# Patient Record
Sex: Female | Born: 1935 | Race: White | Hispanic: No | Smoking: Current some day smoker
Health system: Southern US, Community
[De-identification: ages and names within clinical notes are randomized; demographics above are authoritative.]

## PROBLEM LIST (undated history)

## (undated) DIAGNOSIS — I10 Essential (primary) hypertension: Secondary | ICD-10-CM

## (undated) DIAGNOSIS — I739 Peripheral vascular disease, unspecified: Secondary | ICD-10-CM

## (undated) DIAGNOSIS — M25562 Pain in left knee: Secondary | ICD-10-CM

## (undated) DIAGNOSIS — K219 Gastro-esophageal reflux disease without esophagitis: Secondary | ICD-10-CM

## (undated) DIAGNOSIS — I499 Cardiac arrhythmia, unspecified: Secondary | ICD-10-CM

## (undated) DIAGNOSIS — R0602 Shortness of breath: Secondary | ICD-10-CM

## (undated) DIAGNOSIS — J449 Chronic obstructive pulmonary disease, unspecified: Secondary | ICD-10-CM

## (undated) DIAGNOSIS — I509 Heart failure, unspecified: Secondary | ICD-10-CM

## (undated) DIAGNOSIS — E7800 Pure hypercholesterolemia, unspecified: Secondary | ICD-10-CM

## (undated) DIAGNOSIS — C44601 Unspecified malignant neoplasm of skin of unspecified upper limb, including shoulder: Secondary | ICD-10-CM

## (undated) HISTORY — PX: OTHER SURGICAL HISTORY: SHX169

## (undated) HISTORY — PX: TOTAL ABDOMINAL HYSTERECTOMY W/ BILATERAL SALPINGOOPHORECTOMY: SHX83

---

## 1998-08-16 ENCOUNTER — Ambulatory Visit (HOSPITAL_COMMUNITY): Admission: RE | Admit: 1998-08-16 | Discharge: 1998-08-16 | Payer: Self-pay | Admitting: Gynecology

## 1998-08-16 ENCOUNTER — Other Ambulatory Visit: Admission: RE | Admit: 1998-08-16 | Discharge: 1998-08-16 | Payer: Self-pay | Admitting: Gynecology

## 1998-10-11 ENCOUNTER — Ambulatory Visit (HOSPITAL_COMMUNITY): Admission: RE | Admit: 1998-10-11 | Discharge: 1998-10-11 | Payer: Self-pay | Admitting: Gastroenterology

## 1999-09-26 ENCOUNTER — Other Ambulatory Visit: Admission: RE | Admit: 1999-09-26 | Discharge: 1999-09-26 | Payer: Self-pay | Admitting: Gynecology

## 2000-10-02 ENCOUNTER — Other Ambulatory Visit: Admission: RE | Admit: 2000-10-02 | Discharge: 2000-10-02 | Payer: Self-pay | Admitting: Gynecology

## 2001-09-30 ENCOUNTER — Encounter: Payer: Self-pay | Admitting: Internal Medicine

## 2001-09-30 ENCOUNTER — Inpatient Hospital Stay (HOSPITAL_COMMUNITY): Admission: AD | Admit: 2001-09-30 | Discharge: 2001-10-02 | Payer: Self-pay | Admitting: Internal Medicine

## 2001-10-14 ENCOUNTER — Other Ambulatory Visit: Admission: RE | Admit: 2001-10-14 | Discharge: 2001-10-14 | Payer: Self-pay | Admitting: Gynecology

## 2001-10-14 ENCOUNTER — Emergency Department (HOSPITAL_COMMUNITY): Admission: EM | Admit: 2001-10-14 | Discharge: 2001-10-14 | Payer: Self-pay | Admitting: Emergency Medicine

## 2002-05-13 ENCOUNTER — Encounter: Payer: Self-pay | Admitting: Cardiovascular Disease

## 2002-05-13 ENCOUNTER — Ambulatory Visit (HOSPITAL_COMMUNITY): Admission: RE | Admit: 2002-05-13 | Discharge: 2002-05-13 | Payer: Self-pay | Admitting: Cardiovascular Disease

## 2002-07-21 ENCOUNTER — Ambulatory Visit (HOSPITAL_COMMUNITY): Admission: RE | Admit: 2002-07-21 | Discharge: 2002-07-21 | Payer: Self-pay | Admitting: Internal Medicine

## 2002-07-21 ENCOUNTER — Encounter: Payer: Self-pay | Admitting: Internal Medicine

## 2002-07-25 ENCOUNTER — Emergency Department (HOSPITAL_COMMUNITY): Admission: EM | Admit: 2002-07-25 | Discharge: 2002-07-26 | Payer: Self-pay | Admitting: *Deleted

## 2003-06-20 ENCOUNTER — Ambulatory Visit (HOSPITAL_COMMUNITY): Admission: RE | Admit: 2003-06-20 | Discharge: 2003-06-20 | Payer: Self-pay | Admitting: Gastroenterology

## 2003-06-20 ENCOUNTER — Encounter (INDEPENDENT_AMBULATORY_CARE_PROVIDER_SITE_OTHER): Payer: Self-pay | Admitting: Specialist

## 2004-03-30 ENCOUNTER — Other Ambulatory Visit: Admission: RE | Admit: 2004-03-30 | Discharge: 2004-03-30 | Payer: Self-pay | Admitting: Gynecology

## 2004-05-10 ENCOUNTER — Ambulatory Visit (HOSPITAL_COMMUNITY): Admission: RE | Admit: 2004-05-10 | Discharge: 2004-05-10 | Payer: Self-pay | Admitting: Gynecology

## 2004-11-06 ENCOUNTER — Emergency Department (HOSPITAL_COMMUNITY): Admission: EM | Admit: 2004-11-06 | Discharge: 2004-11-06 | Payer: Self-pay | Admitting: Emergency Medicine

## 2004-11-07 ENCOUNTER — Emergency Department (HOSPITAL_COMMUNITY): Admission: EM | Admit: 2004-11-07 | Discharge: 2004-11-08 | Payer: Self-pay | Admitting: Emergency Medicine

## 2005-03-24 ENCOUNTER — Emergency Department (HOSPITAL_COMMUNITY): Admission: EM | Admit: 2005-03-24 | Discharge: 2005-03-24 | Payer: Self-pay | Admitting: Emergency Medicine

## 2005-06-11 ENCOUNTER — Ambulatory Visit (HOSPITAL_COMMUNITY): Admission: RE | Admit: 2005-06-11 | Discharge: 2005-06-11 | Payer: Self-pay | Admitting: Cardiovascular Disease

## 2005-06-14 ENCOUNTER — Ambulatory Visit (HOSPITAL_COMMUNITY): Admission: RE | Admit: 2005-06-14 | Discharge: 2005-06-14 | Payer: Self-pay | Admitting: Cardiovascular Disease

## 2005-10-25 ENCOUNTER — Emergency Department (HOSPITAL_COMMUNITY): Admission: EM | Admit: 2005-10-25 | Discharge: 2005-10-25 | Payer: Self-pay | Admitting: Emergency Medicine

## 2006-01-08 ENCOUNTER — Emergency Department (HOSPITAL_COMMUNITY): Admission: EM | Admit: 2006-01-08 | Discharge: 2006-01-08 | Payer: Self-pay | Admitting: Emergency Medicine

## 2006-03-31 ENCOUNTER — Other Ambulatory Visit: Admission: RE | Admit: 2006-03-31 | Discharge: 2006-03-31 | Payer: Self-pay | Admitting: Gynecology

## 2006-08-05 HISTORY — PX: INSERT / REPLACE / REMOVE PACEMAKER: SUR710

## 2006-08-27 ENCOUNTER — Encounter (INDEPENDENT_AMBULATORY_CARE_PROVIDER_SITE_OTHER): Payer: Self-pay | Admitting: Specialist

## 2006-08-27 ENCOUNTER — Ambulatory Visit (HOSPITAL_COMMUNITY): Admission: RE | Admit: 2006-08-27 | Discharge: 2006-08-27 | Payer: Self-pay | Admitting: Gastroenterology

## 2007-03-29 ENCOUNTER — Observation Stay (HOSPITAL_COMMUNITY): Admission: EM | Admit: 2007-03-29 | Discharge: 2007-04-02 | Payer: Self-pay | Admitting: Emergency Medicine

## 2007-04-13 ENCOUNTER — Inpatient Hospital Stay (HOSPITAL_COMMUNITY): Admission: RE | Admit: 2007-04-13 | Discharge: 2007-04-14 | Payer: Self-pay | Admitting: *Deleted

## 2008-05-12 ENCOUNTER — Ambulatory Visit (HOSPITAL_COMMUNITY): Admission: RE | Admit: 2008-05-12 | Discharge: 2008-05-12 | Payer: Self-pay | Admitting: Family Medicine

## 2008-05-13 ENCOUNTER — Ambulatory Visit (HOSPITAL_COMMUNITY): Admission: RE | Admit: 2008-05-13 | Discharge: 2008-05-13 | Payer: Self-pay | Admitting: Family Medicine

## 2008-05-26 ENCOUNTER — Ambulatory Visit (HOSPITAL_COMMUNITY): Admission: RE | Admit: 2008-05-26 | Discharge: 2008-05-26 | Payer: Self-pay | Admitting: Family Medicine

## 2009-05-11 ENCOUNTER — Encounter: Payer: Self-pay | Admitting: Orthopedic Surgery

## 2009-05-11 ENCOUNTER — Ambulatory Visit (HOSPITAL_COMMUNITY): Admission: RE | Admit: 2009-05-11 | Discharge: 2009-05-11 | Payer: Self-pay | Admitting: Family Medicine

## 2009-06-20 ENCOUNTER — Ambulatory Visit (HOSPITAL_COMMUNITY): Admission: RE | Admit: 2009-06-20 | Discharge: 2009-06-20 | Payer: Self-pay | Admitting: Family Medicine

## 2010-06-04 ENCOUNTER — Encounter: Payer: Self-pay | Admitting: Orthopedic Surgery

## 2010-06-06 ENCOUNTER — Ambulatory Visit: Payer: Self-pay | Admitting: Orthopedic Surgery

## 2010-06-06 DIAGNOSIS — M1710 Unilateral primary osteoarthritis, unspecified knee: Secondary | ICD-10-CM

## 2010-06-07 ENCOUNTER — Encounter: Payer: Self-pay | Admitting: Orthopedic Surgery

## 2010-06-15 ENCOUNTER — Encounter: Payer: Self-pay | Admitting: Orthopedic Surgery

## 2010-07-12 ENCOUNTER — Encounter: Payer: Self-pay | Admitting: Orthopedic Surgery

## 2010-07-18 ENCOUNTER — Ambulatory Visit: Payer: Self-pay | Admitting: Orthopedic Surgery

## 2010-07-18 DIAGNOSIS — IMO0002 Reserved for concepts with insufficient information to code with codable children: Secondary | ICD-10-CM | POA: Insufficient documentation

## 2010-08-02 ENCOUNTER — Telehealth: Payer: Self-pay | Admitting: Orthopedic Surgery

## 2010-08-03 ENCOUNTER — Encounter: Payer: Self-pay | Admitting: Orthopedic Surgery

## 2010-08-05 HISTORY — PX: CATARACT EXTRACTION, BILATERAL: SHX1313

## 2010-08-21 ENCOUNTER — Ambulatory Visit
Admission: RE | Admit: 2010-08-21 | Discharge: 2010-08-21 | Payer: Self-pay | Source: Home / Self Care | Attending: Orthopedic Surgery | Admitting: Orthopedic Surgery

## 2010-08-23 ENCOUNTER — Encounter: Payer: Self-pay | Admitting: Orthopedic Surgery

## 2010-08-24 ENCOUNTER — Encounter (HOSPITAL_COMMUNITY)
Admission: RE | Admit: 2010-08-24 | Discharge: 2010-09-04 | Payer: Self-pay | Source: Home / Self Care | Attending: Orthopedic Surgery | Admitting: Orthopedic Surgery

## 2010-09-04 NOTE — Letter (Signed)
Summary: Medication List  Medication List   Imported By: Jacklynn Ganong 06/11/2010 11:31:08  _____________________________________________________________________  External Attachment:    Type:   Image     Comment:   External Document

## 2010-09-04 NOTE — Letter (Signed)
Summary: Previous notes from Dr. Alanda Amass  Previous notes from Dr. Alanda Amass   Imported By: Jacklynn Ganong 06/07/2010 08:10:41  _____________________________________________________________________  External Attachment:    Type:   Image     Comment:   External Document

## 2010-09-04 NOTE — Letter (Signed)
Summary: Out of Work  [facility]  [address]  [place], [place] [address]   Phone: [phone]  Fax: [phone]    June 06, 2010   Employee:  [name]    To Whom It May Concern:   For Medical reasons, please excuse the above named employee from work for the next 6 weeks   If you need additional information, please feel free to contact our office.         Sincerely,    Fuller Canada MD

## 2010-09-04 NOTE — Letter (Signed)
Summary: History form  History form   Imported By: Jacklynn Ganong 06/07/2010 08:09:48  _____________________________________________________________________  External Attachment:    Type:   Image     Comment:   External Document

## 2010-09-04 NOTE — Letter (Signed)
Summary: FMLA form  FMLA form   Imported By: Cammie Sickle 06/25/2010 12:56:45  _____________________________________________________________________  External Attachment:    Type:   Image     Comment:   External Document

## 2010-09-04 NOTE — Assessment & Plan Note (Signed)
Summary: left knee pain needs xr/weintraub/bsf   Visit Type:  Initial Consult Referring Provider:  Dr. Alanda Amass  CC:  left knee pain.  History of Present Illness: I saw [name] in the office today for an initial visit.  She is a 77 years old woman with the complaint of:  left knee.  Xrays today.  This is a 77 year old female, who was referred to [place] by the cardiologist for LEFT knee pain.  The patient has pain in her knee with cold weather, which she describes as throbbing intermittent and worse with standing. She reports some bruising, as well as some swelling, although there's been no trauma. Her pain is reported to be 6/10. The pain occurs morning and night as well. There is an indication from her cardiologist that she has responded to injections in the past. She has an x-ray from 2010, which shows mild to moderate joint space narrowing, equal on both sides, consistent with mild to moderate osteoarthritis.  She is currently on Mobic since mid-October and he also took her out of work on October 14.  Allergies (verified): 1)  ! Penicillin  Past History:  Past Surgical History: T A H and B S O Pacemaker:  Polyps in throat  Review of Systems Constitutional:  Denies weight loss, weight gain, fever, chills, and fatigue. Cardiovascular:  Denies chest pain, palpitations, fainting, and murmurs. Respiratory:  Complains of couch; denies short of breath, wheezing, tightness, pain on inspiration, and snoring . Gastrointestinal:  Denies heartburn, nausea, vomiting, diarrhea, constipation, and blood in your stools. Genitourinary:  Denies frequency, urgency, difficulty urinating, painful urination, flank pain, and bleeding in urine. Neurologic:  Denies numbness, tingling, unsteady gait, dizziness, tremors, and seizure. Musculoskeletal:  Complains of joint pain and swelling; denies instability, stiffness, redness, heat, and muscle pain. Endocrine:  Complains of heat or cold intolerance;  denies excessive thirst and exessive urination. Psychiatric:  Denies nervousness, depression, anxiety, and hallucinations. Skin:  Denies changes in the skin, poor healing, rash, itching, and redness. HEENT:  Complains of blurred or double vision; denies eye pain, redness, and watering. Immunology:  Denies seasonal allergies, sinus problems, and allergic to bee stings. Hemoatologic:  Complains of easy bleeding and brusing.  Physical Exam  Skin:  intact without lesions or rashes Inguinal Nodes:  no significant adenopathy Psych:  alert and cooperative; normal mood and affect; normal attention span and concentration   Knee Exam  General:    Well-developed, well-nourished, normal body habitus; no deformities, normal grooming.  Gait:    Normal heel-toe gait pattern bilaterally.    Skin:    Intact, no scars, lesions, rashes, cafe au lait spots, or bruising.    Inspection:     No deformity, ecchymosis or swelling.   Palpation:    tenderness L-medial joint line and tenderness L-lateral joint line.    Vascular:    There was no swelling or varicose veins. The pulses and temperature are normal. There was no edema or tenderness.  Sensory:    Gross coordination and sensation were normal.    Motor:    Motor strength 5/5 bilaterally for quadriceps, hamstrings, ankle dorsiflexion, and ankle plantar flexion.    Reflexes:    Normal and symmetric patellar and Achilles reflexes bilaterally.    Knee Exam:    Right:    Inspection:  Normal    Palpation:  Normal    Range of Motion:       Flexion-Active: full    Left:    Inspection:  Abnormal  Palpation:  Abnormal    Stability:  stable    Swelling:  no    Range of Motion:       Flexion-Active: full   Impression & Recommendations:  Problem # 1:  KNEE, ARTHRITIS, DEGEN./OSTEO (ICD-715.96)  x-rays of the LEFT knee, show that she has arthritis, mild to moderate minimal deformity.  Impression mild-to-moderate arthritis, and minimal  deformity.  The LEFT knee was injected Verbal consent was obtained. The knee was prepped with alcohol and ethyl chloride. 1 cc of depomedrol 40mg /cc and 4 cc of lidocaine 1% was injected. there were no complications.  Orders: New Patient Level III (95284) Knee x-ray,  3 views (13244) Joint Aspirate / Injection, Large (20610) Depo- Medrol 40mg  (J1030)  Patient Instructions: 1)  You have received an injection of cortisone today. You may experience increased pain at the injection site. Apply ice pack to the area for 20 minutes every 2 hours and take 2 xtra strength tylenol every 8 hours. This increased pain will usually resolve in 24 hours. The injection will take effect in 3-10 days.  2)  OOW 6 weeks  3)  Recommend knee sleeve (pick up from [facility])   Orders Added: 1)  New Patient Level III [01027] 2)  Knee x-ray,  3 views [73562] 3)  Joint Aspirate / Injection, Large [20610] 4)  Depo- Medrol 40mg  [J1030]

## 2010-09-04 NOTE — Letter (Signed)
Summary: *Consult Note  [facility]  [address]  [place], [place] [address]   Phone: [phone]  Fax: [phone]    Re:    [name] Jose DOB:    03-31-1930   Dear: Dr Alanda Amass    Thank you for requesting that we see the above patient for consultation.  A copy of the detailed office note will be sent under separate cover, for your review.  Evaluation today is consistent with: arthritis    Our recommendation is for: injection, brace and continue Mobic. She is OOW x 6 weeks.   Thank you for this opportunity to look after your patient.  Sincerely,   Fuller Canada MD

## 2010-09-05 ENCOUNTER — Encounter: Payer: Self-pay | Admitting: Orthopedic Surgery

## 2010-09-05 ENCOUNTER — Ambulatory Visit (HOSPITAL_COMMUNITY): Payer: Self-pay

## 2010-09-06 ENCOUNTER — Encounter: Payer: Self-pay | Admitting: Orthopedic Surgery

## 2010-09-06 NOTE — Letter (Signed)
Summary: [facility] form   [facility] form   Imported By: Cammie Sickle 08/28/2010 17:39:54  _____________________________________________________________________  External Attachment:    Type:   Image     Comment:   External Document

## 2010-09-06 NOTE — Letter (Signed)
Summary: Medical office notes fax [facility] ins  Medical office notes fax [facility] [employer] [facility]   Imported By: Cammie Sickle 08/03/2010 17:24:48  _____________________________________________________________________  External Attachment:    Type:   Image     Comment:   External Document

## 2010-09-06 NOTE — Letter (Signed)
Summary: [facility] disab form and medical records fax  [facility] dis[facility] form and medical records fax   Imported By: Cammie Sickle 07/16/2010 13:52:23  _____________________________________________________________________  External Attachment:    Type:   Image     Comment:   External Document

## 2010-09-06 NOTE — Assessment & Plan Note (Signed)
Summary: 1 M RE-CK LT KNEE/HUMANA/CAF   Visit Type:  Follow-up Referring Provider:  Dr. Alanda Amass  CC:  recheck left knee.  History of Present Illness: ZO:XWRUEAV # 1:  KNEE, ARTHRITIS, DEGEN./OSTEO (ICD-715.96) Problem #2: Has bursitis.  Treatment:injection and brace   Complaints:medial knee pain   Today, scheduled for:re check after injection and being out of work.  The injection helped for a few days.  Ice helps with swelling.  ROS had right eye implant put in on Monday, doing well with her eye surgery   Meds: Lotensin, Eye drops, D3, Centrum, Plavix, Durezol, Amlodipine, Omeprazole, no pain or Oa med, Bengay topical for knee.   Allergies: 1)  ! Penicillin  Physical Exam  Additional Exam:  LEFT knee.  The LEFT knee is tender over the heads bursa, which is swollen. She also has tenderness in the medial joint line.  She has a small joint effusion.  Her flexion arc is 125. She has some pain when we go into full extension, but she has negative McMurray sign.  Her motor exam is normal. Her knee joint is stable. She has good pulses and perfusion to the limb with normal sensation.  She ambulated without assistive device   Impression & Recommendations:  Problem # 1:  ANSERINE BURSITIS, LEFT (ICD-726.61)  Orders: Physical Therapy Referral (PT) Est. Patient Level III (40981)  Problem # 2:  KNEE, ARTHRITIS, DEGEN./OSTEO (ICD-715.96)  review of her x-ray it shows symmetric joint space narrowing moderate arthritis in the LEFT knee.  Recommend physical therapy with iontophoresis, and Asper cream 3 times a day recheck week of February 13  Orders: Physical Therapy Referral (PT) Est. Patient Level III (19147)  Patient Instructions: 1)  Use aspercreme on area 3 x day 2)  Go for the PT treatment [facility] 3)  OOW for 5 weeks 4)  Come back week of Feb. 13th   Orders Added: 1)  Physical Therapy Referral [PT] 2)  Est. Patient Level III [82956]

## 2010-09-06 NOTE — Letter (Signed)
Summary: Out of Work  [facility]  [address]  [place], [place] [address]   Phone: [phone]  Fax: [phone]     August 03, 2010 ** PER July 18, 2010 office visit   Employee:  [name]    To Whom It May Concern:  The above named patient / employee, [name], was seen for an appointment in our office  July 18, 2010.  Please note:   For Medical reasons, please excuse the above named employee from work for the following dates:  Continue out of work status X1 month.  Start date:  06/06/10  End date:  08/21/10  Next appointment date:  08/21/10 at 11:15am    If you need additional information, please feel free to contact our office.         Sincerely,    Cammie Sickle

## 2010-09-06 NOTE — Assessment & Plan Note (Signed)
Summary: 6 WK RE-CHECK LT KNEE/HUMANA/CAF   Visit Type:  Follow-up Referring Terryann Verbeek:  Dr. Alanda Amass  CC:  left knee pain.  History of Present Illness: ZO:XWRUEAV # 1:  KNEE, ARTHRITIS, DEGEN./OSTEO (ICD-715.96)  Treatment:injection and brace   Complaints:medial knee pain   Today, scheduled for:re check   Review of systems patient recently had surgery on the left eye scheduled for surgery on the RIGHT I not ready to go back to work yet continues to have knee pain medially.  well-developed well-nourished female and hygiene intact have a small skin normal over the LEFT knee pain is primarily over the hamstrings and has bursa.  Range of motion is normal no effusion strength is normal knee is stable no meniscal signs  Pulse and perfusion are normal sensation over the LEFT lower extremity normal  Inject pes anserine tendons and bursa  Verbal consent was obtained. The knee was prepped with alcohol and ethyl chloride. 1 cc of depomedrol 40mg /cc and 4 cc of lidocaine 1% was injected. there were no complications.   Allergies: 1)  ! Penicillin   Impression & Recommendations:  Problem # 1:  ANSERINE BURSITIS, LEFT (ICD-726.61) Assessment New  Orders: Est. Patient Level III (40981) Joint Aspirate / Injection, Large (20610) Depo- Medrol 40mg  (J1030)  Problem # 2:  KNEE, ARTHRITIS, DEGEN./OSTEO (XBJ-478.29) Assessment: Improved  Orders: Est. Patient Level III (56213)  Patient Instructions: 1)  You have received an injection of cortisone today. You may experience increased pain at the injection site. Apply ice pack to the area for 20 minutes every 2 hours and take 2 xtra strength tylenol every 8 hours. This increased pain will usually resolve in 24 hours. The injection will take effect in 3-10 days.   2)  knee follow up 1 month  3)  Stay out of work x 1 month    Orders Added: 1)  Est. Patient Level III [08657] 2)  Joint Aspirate / Injection, Large [20610] 3)  Depo- Medrol  40mg  [J1030]

## 2010-09-06 NOTE — Progress Notes (Signed)
Summary: patient called about her disability papers.  Phone Note Call from Patient   Caller: Patient Summary of Call: Patient was calling about her disability papers for work has not been turned in since 07-18-10. Initial call taken by: Waldon Reining,  August 02, 2010 11:20 AM  Follow-up for Phone Call        Re-sent via fax - office notes of 11/02 originallly sent by [employer] and copy of 07/18/10 office notes to ATTN: Camille Bal, case manager, [employer], fax [phone] (ph #[phone].  Berkley Harvey has been signed and attached.  Contacted patient - left msg accordingly.  Follow-up by: Cammie Sickle,  August 03, 2010 12:46 PM

## 2010-09-12 NOTE — Letter (Signed)
Summary: [employer] fax request  [employer] fax request   Imported By: Cammie Sickle 09/06/2010 10:27:30  _____________________________________________________________________  External Attachment:    Type:   Image     Comment:   External Document

## 2010-09-15 ENCOUNTER — Encounter: Payer: Self-pay | Admitting: Orthopedic Surgery

## 2010-09-19 ENCOUNTER — Encounter: Payer: Self-pay | Admitting: Orthopedic Surgery

## 2010-09-19 ENCOUNTER — Ambulatory Visit (INDEPENDENT_AMBULATORY_CARE_PROVIDER_SITE_OTHER): Payer: Medicare HMO | Admitting: Orthopedic Surgery

## 2010-09-19 DIAGNOSIS — M629 Disorder of muscle, unspecified: Secondary | ICD-10-CM | POA: Insufficient documentation

## 2010-09-19 DIAGNOSIS — M1710 Unilateral primary osteoarthritis, unspecified knee: Secondary | ICD-10-CM

## 2010-09-19 DIAGNOSIS — IMO0002 Reserved for concepts with insufficient information to code with codable children: Secondary | ICD-10-CM

## 2010-09-20 ENCOUNTER — Encounter: Payer: Self-pay | Admitting: Orthopedic Surgery

## 2010-09-26 NOTE — Miscellaneous (Signed)
Summary: PT discharge summary  PT discharge summary   Imported By: Jacklynn Ganong 09/18/2010 15:43:09  _____________________________________________________________________  External Attachment:    Type:   Image     Comment:   External Document

## 2010-09-26 NOTE — Assessment & Plan Note (Signed)
Summary: 4 wk reck lt knee/humana/wkj   Visit Type:  Follow-up Referring Provider:  Dr. Alanda Amass  CC:  left knee pain.  History of Present Illness:  History of Present Illness: ZO:XWRUEAV # 1:  KNEE, ARTHRITIS, DEGEN./OSTEO (ICD-715.96) Problem #2: Has bursitis.  Treatment:injection and brace   Complaints:medial knee pain   Today, scheduled for recheck after PT.  Meds: Lotensin, Eye drops, D3, Centrum, Plavix, Durezol, Amlodipine, Omeprazole, no pain or Oa med, Bengay topical for knee.  Complaints: She states that therapy went well, she is finished.   Her new symptoms include lateral knee pain over the iliotibial band and biceps near the knee joint, fibula, and Gertie's tubercle  ROS: SKIN CANCER    Allergies: 1)  ! Penicillin   Knee Exam  General:    Well-developed, well-nourished, normal body habitus; no deformities, normal grooming.  Gait:    Normal heel-toe gait pattern bilaterally.    Skin:    Intact, no scars, lesions, rashes, cafe au lait spots, or bruising.    Palpation:    tenderness L-lateral joint line.    Vascular:    There was no swelling or varicose veins. The pulses and temperature are normal. There was no edema or tenderness.  Sensory:    Gross coordination and sensation were normal.    Motor:    Motor strength 5/5 bilaterally for quadriceps, hamstrings, ankle dorsiflexion, and ankle plantar flexion.    Knee Exam:    Left:    Inspection:  Abnormal    Palpation:  Abnormal    Stability:  stable    Tenderness:  ITB    Swelling:  ITB    Erythema:  no    Range of Motion:       Flexion-Active: 135 degrees       Extension-Active: full   Impression & Recommendations:  Problem # 1:  KNEE, ARTHRITIS, DEGEN./OSTEO (WUJ-811.91) Assessment Improved  Orders: Est. 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 The left knee was prepped with alcohol  and ethyl chloride. 1 cc of depomedrol 40mg /cc and 4 cc of lidocaine 1% was injected. there were no complications. ITB  Orders: Est. Patient Level III (56213) Joint Aspirate / Injection, Large (20610) Depo- Medrol 40mg  (J1030)  Patient Instructions: 1)  Please schedule a follow-up appointment in 3 months. 2)  You have received an injection of cortisone today. You may experience increased pain at the injection site. Apply ice pack to the area for 20 minutes every 2 hours and take 2 xtra strength tylenol every 8 hours. This increased pain will usually resolve in 24 hours. The injection will take effect in 3-10 days.  3)  Please schedule a follow-up appointment as needed.   Orders Added: 1)  Est. Patient Level III [08657] 2)  Joint Aspirate / Injection, Large [20610] 3)  Depo- Medrol 40mg  [J1030]  Appended Document: 4 wk reck lt knee/humana/wkj OOW 3 MONTHS

## 2010-09-26 NOTE — Letter (Signed)
Summary: [employer] [facility] disab ins records fax  [employer] [employer] disab ins records fax   Imported By: Cammie Sickle 09/19/2010 11:51:42  _____________________________________________________________________  External Attachment:    Type:   Image     Comment:   External Document

## 2010-09-26 NOTE — Letter (Signed)
Summary: Out of Work  [facility]  [address]  [place], [place] [address]   Phone: [phone]  Fax: [phone]    September 19, 2010   Employee:  [name]    To Whom It May Concern:   For Medical reasons, please excuse the above named employee from work for the following dates:  Start:   06/06/10  Per appointment / office visit today, Continue out of work status X3 months due to medical reasons  End:   12/19/10  If you need additional information, please feel free to contact our office.         Sincerely,    Terrance Mass, MD

## 2010-09-28 ENCOUNTER — Encounter: Payer: Self-pay | Admitting: Orthopedic Surgery

## 2010-10-01 ENCOUNTER — Encounter: Payer: Self-pay | Admitting: Orthopedic Surgery

## 2010-10-08 ENCOUNTER — Encounter: Payer: Self-pay | Admitting: Orthopedic Surgery

## 2010-10-11 NOTE — Letter (Signed)
Summary: [facility] [employer] update form  [facility] update form   Imported By: Cammie Sickle 10/02/2010 19:08:54  _____________________________________________________________________  External Attachment:    Type:   Image     Comment:   External Document

## 2010-10-11 NOTE — Letter (Signed)
Summary: Form faxed to employer [employer] [facility] [employer] [employer]  Form faxed to employer [employer]   Imported By: Cammie Sickle 10/01/2010 10:20:48  _____________________________________________________________________  External Attachment:    Type:   Image     Comment:   External Document

## 2010-10-11 NOTE — Letter (Signed)
Summary: Office notes 09/20/10 faxed [employer]   Office notes 09/20/10 faxed [employer]   Imported By: Cammie Sickle 10/01/2010 10:19:42  _____________________________________________________________________  External Attachment:    Type:   Image     Comment:   External Document

## 2010-10-16 NOTE — Letter (Signed)
Summary: Re-Faxed [employer] form*ALT Fax #  Re-Faxed [employer] form*ALT Fax #   Imported By: Cammie Sickle 10/08/2010 19:10:55  _____________________________________________________________________  External Attachment:    Type:   Image     Comment:   External Document

## 2010-10-18 ENCOUNTER — Encounter: Payer: Self-pay | Admitting: Orthopedic Surgery

## 2010-10-18 ENCOUNTER — Ambulatory Visit (INDEPENDENT_AMBULATORY_CARE_PROVIDER_SITE_OTHER): Payer: Medicare HMO | Admitting: Orthopedic Surgery

## 2010-10-18 DIAGNOSIS — IMO0002 Reserved for concepts with insufficient information to code with codable children: Secondary | ICD-10-CM

## 2010-10-18 DIAGNOSIS — M1710 Unilateral primary osteoarthritis, unspecified knee: Secondary | ICD-10-CM

## 2010-10-18 DIAGNOSIS — M629 Disorder of muscle, unspecified: Secondary | ICD-10-CM

## 2010-10-23 NOTE — Assessment & Plan Note (Signed)
Summary: 4 wk RE-CK LT [facility] MEDICARE/CAF   Visit Type:  Follow-up Referring Provider:  Dr. Alanda Amass  CC:  knee pain.  History of Present Illness:  History of Present Illness: ZO:XWRUEAV # 1:  KNEE, ARTHRITIS, DEGEN./OSTEO (ICD-715.96) Problem #2: Has bursitis. Problem #3 iliotibial band distal tendinitis  Left knee pain.  Treatment:injection and brace   Meds: Lotensin, Eye drops, D3, Centrum, Plavix, Durezol, Amlodipine, Omeprazole, no pain or Oa med, Bengay topical for knee.  Her knee has continued to improve.  She is using BenGay and along the lateral portion of her knee.  Aspercreme did not work for her.  The injection, the physical therapy and a BenGay seems to be working.  For some reason her insurance company and has required her to come for an office visit once a month.  There is no need for this it is not clinically indicated.  I told her we can update her forms once a month but there is no need to come in once a month for this problem.  This is a self-limiting problem with proper treatment which will get better over 6-8 week period.    Allergies: 1)  ! Penicillin  Physical Exam  Additional Exam:  LEFT knee exam.  There is no joint effusion or joint line tenderness.  She is tender over the iliotibial band as it crosses the knee joint and inserts on the tibia.  She still has full range of motion.  She still has normal muscle strength and muscle tone in her knee remained stable.  She is ambulating without any assistive device.  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 Patient Level II (40981)  Problem # 2:  ANSERINE BURSITIS, LEFT (ICD-726.61) Assessment: Comment Only  Orders: Est. Patient Level II (19147)  Problem # 3:  KNEE, ARTHRITIS, DEGEN./OSTEO  (ICD-715.96) Assessment: Comment Only  Orders: Est. Patient Level II (82956)  Patient Instructions: 1)  Has appt for May 16th to be seen here   Orders Added: 1)  Est. Patient Level II [21308]

## 2010-12-18 NOTE — H&P (Signed)
NAME:  [name], [name]               ACCOUNT NO.:  [id number]   MEDICAL RECORD NO.:  [id number]          PATIENT TYPE:  INP   LOCATION:  A219                          FACILITY:  [facility]   PHYSICIAN:  Dorris Singh, DO    DATE OF BIRTH:  February 09, 1930   DATE OF ADMISSION:  03/29/2007  DATE OF DISCHARGE:  LH                              HISTORY & PHYSICAL   [name] is a 77 year old Caucasian female who presented to the [facility] emergency room with a complaint of syncope.  She apparently had a  syncopal episode that was preceded by lightheadedness. At that point in  time, she actually hit her head as well.  Her family then contacted EMS,  and she was brought to the emergency room for treatment.   PAST MEDICAL HISTORY:  Significant for:  1. Hypertension.  2. GERD.  3. Hyperlipidemia.  4. An irregular heartbeat.  5. She has a history of a hysterectomy.   SOCIAL HISTORY:  She smokes cigarettes but is nondrinker.  Denies any  illicit drug use.  She currently is employed and is single.   ALLERGIES:  She has a drug allergy to PENICILLIN.   CURRENT MEDICATIONS:  1. Aspirin 81 mg p.o. daily.  2. Plavix 75 mg p.o. daily.  3. Lotrel 10/20 mg p.o. daily.  4. Lipitor 20 mg at bedtime.  5. Protonix 40 mg once a day.  6. Nexium 40 mg once a day.  7. VoSpire ER 8 mg b.i.d.  8. Coricidin D p.r.n.   REVIEW OF SYSTEMS:  Positive for weakness.  Positive for syncopal  episode. Positive for hypertension, positive for dyspepsia and positive  for irregular heart beat.  GASTROINTESTINAL:  Negative for nausea,  vomiting or diarrhea and constipation. GU:  Negative for dysuria or  hematuria or nocturia. MUSCULOSKELETAL:  Negative for weakness.  NEUROLOGIC:  Positive for syncopal episode.   PHYSICAL EXAMINATION:  VITAL SIGNS:  Temperature is 97.3, pulse 60,  respirations 18, blood pressure 131/65.  GENERAL:  This is a Caucasian female who is well developed, well  nourished, in no acute distress  stress.  She answers all questions  appropriately.  SKIN:  Tan with sun damage.  However, no skin scars or rashes noted.  HEENT:  Head is normocephalic, atraumatic.  Pupils are PERRLA and EOMI.  Ears are symmetrical.  TMs visualized bilaterally. Gross auditory acuity  intact.  Nose: No turbinate inflammation.  Throat:  Partials and  dentures. No erythema or exudate.  NECK:  No masses.  Full range of motion noted.  HEART:  Regular rate and rhythm.  No rubs, gallops or murmurs.  LUNGS:  Clear to auscultation bilaterally.  No wheezes, rales or  rhonchi.  ABDOMEN:  Soft, nontender, nondistended.  No guarding or rebound noted.  MUSCULOSKELETAL:  No muscular atrophy.  No muscular weakness and full  range of motion of all extremities.  NEUROLOGIC:  Cranial nerves II-XII grossly intact.   LABORATORY DATA:  She had a CBC with a white count of 10.6, hemoglobin  of 13.0, hematocrit of 37.8, and platelet count of 248. Her  chemistries  showed sodium 141, potassium 3.9, chloride 106, CO2 29, glucose 97, BUN  11, and creatinine 0.65. Her cardiac markers were within normal limits.  Her thyroid was within normal limits.   She also had images done.  She had a CT of the of the head without  contrast which demonstrated acute posterior parietal scalp hematoma, no  underlying fracture, tiny left frontal hyperattenuation along the gray-  white matter junction suspicious for small hemorrhagic contusion and  atrophy and microvascular ischemic changes.   Also she had a chest x-ray which demonstrated stable chronic COPD.   She had a CT angiogram of the chest for pulmonary embolism which showed  no pulmonary emboli but a 1 cm nodule peripherally in the right lower  lobe. They recommend a CT in 6 months for this.  COPD and emphysema with  peripheral blebs bilaterally.  No acute cardiopulmonary disease and a  left adrenal nodule, intermittent but statistically consistent with  adenoma. This can be reevaluated  at the time of the CT-PET scan for  followup CT.   ASSESSMENT AND PLAN:  1. Syncopal episode, etiology unknown.  2. Head contusion.  3. Hypertension.  4. Bradycardia.  5. Gastroesophageal reflux disease.   PLAN:  1. We will admit the patient to the service of [facility].  2. Will have neurology and [facility] to consult and      participate along with Dr. Gerilyn Pilgrim of neurology.  3. Will have neurologic checks done.  4. Will keep the patient n.p.o. until seen by neurology.  5. Will do orthostatic vital signs as well.  6. Will do a 2-D echocardiogram and carotid study.  7. Will have PT come and see the patient.  8. DVT and GI prophylaxis.  9. Will place the patient on her home medications.      Dorris Singh, DO  Electronically Signed     CB/MEDQ  D:  03/30/2007  T:  03/30/2007  Job:  [id number]

## 2010-12-18 NOTE — Consult Note (Signed)
NAME:  [name], [name]               ACCOUNT NO.:  [id number]   MEDICAL RECORD NO.:  [id number]          PATIENT TYPE:  INP   LOCATION:  A219                          FACILITY:  [facility]   PHYSICIAN:  Kofi A. Gerilyn Pilgrim, M.D. DATE OF BIRTH:  Feb 23, 1930   DATE OF CONSULTATION:  03/30/2007  DATE OF DISCHARGE:                                 CONSULTATION   PR[facility] NEUROLOGY CONSULTATION   REASON FOR CONSULTATION:  Syncope.   This is a 77 year old, right-handed, white female, who presents with a  syncopal episode of loss of consciousness.  The patient had not been  feeling well for the last few days and apparently has been out in the  grass and fields for the past week or so.  She reports that she took  over-the-counter sinus medication on the night before the day in  question.  She was supposed to take one tablet but apparently took two  instead.  On awakening the following day, she stood up, became  lightheaded, and blacked out, hitting the back of her head.  She reports  no focal neurological signs, no chest pain, no headaches before the  event.  She does report having a headache in the back of the head where  she injured it.  She denies any oral trauma such as biting the tongue or  the lips, there was no clear clonic-tonic activity or urinary  incontinence.  No recurrent syncopal episodes are reported, and no prior  history of seizures or syncope.   PAST MEDICAL HISTORY:  1. Hyperlipidemia.  2. Gastroesophageal reflux disease.  3. Hypertension.   PAST SURGICAL HISTORY:  Hysterectomy.   SOCIAL HISTORY:  She does smoke cigarettes, no alcohol use, no illicit  drug use, she is employed and single.   ALLERGIES:  PENICILLIN.   ADMISSION MEDICATIONS:  Aspirin 81 mg, Plavix 75 mg, Lotrel, Lipitor,  Protonix, Nexium, Coricidin, which is the medication she took for her  sinus symptoms before she had them.   REVIEW OF SYSTEMS:  Really unremarkable other than stated in the History  of  Present Illness.   PHYSICAL EXAMINATION:  GENERAL:  A pleasant lady in no acute distress.  VITAL SIGNS:  Temperature 97.1, pulse 58, respirations 22, blood  pressure 118/58.  HEENT EVALUATION:  Neck is supple.  There is a small scalp hematoma  evolving the left occiput.  EXTREMITIES:  No edema.  ABDOMEN:  Soft.  MENTATION:  She is awake and alert.  She converses well.  Speech,  language, and cognition are intact.  CRANIAL NERVE EVALUATION:  Pupils are equally round and reactive to  light and accommodation, extraocular movements are intact, visual fields  are full, facial motor strength is symmetric, tongue is midline, uvula  is midline, shoulder shrugs are normal.  MOTOR EXAMINATION:  Normal  tone, bulk, and strength.  Coordination shows no intention tremor or  dysmetria or any other type of tremors.  There is no parkinsonism.  Reflexes are symmetric with downgoing plantar reflexes.  SENSATION:  Normal to temperature and light touch.   CT scan of the brain shows a left  scalp hematoma, otherwise unrevealing.   IMPRESSION:  Single syncopal episode.  Etiology is probably  multifactorial including medication effect with the patient taking more  of the over-the-counter medication than was recommended.  Given her age,  certainly a cardiac event could also be a possibility.  She may also  have carotid disease, but I do not believe this single entity explains  her symptomatology, more than likely this is a combination event  including the medication.  There may also be some possibility of  orthostasis.   RECOMMENDATIONS:  1. Discontinue Coricidin.  2. A carotid Doppler, which actually has been ordered.  3. Orthostatic pressures and pulses.   Thanks for this consultation.      Kofi A. Gerilyn Pilgrim, M.D.  Electronically Signed     KAD/MEDQ  D:  04/01/2007  T:  04/01/2007  Job:  [id number]

## 2010-12-18 NOTE — Op Note (Signed)
[name], [name]               ACCOUNT NO.:  [id number]   MEDICAL RECORD NO.:  [id number]          PATIENT TYPE:  OIB   LOCATION:  2807                         FACILITY:  [facility]   PHYSICIAN:  Darlin Priestly, MD  DATE OF BIRTH:  1930/07/05   DATE OF PROCEDURE:  04/13/2007  DATE OF DISCHARGE:                               OPERATIVE REPORT   PROCEDURE:  Insertion of a St. Jude Zephyr XL DR, model number 5826,  serial number [id number].   ATTENDING:  Darlin Priestly, MD, Ritta Slot, MD   COMPLICATIONS:  None.   INDICATIONS:  [name] is a 77 year old female patient of Dr. Franchot Heidelberg and Dr. Artis Delay with a history of known PVD, a history of  CAD, remote smoking history, hyperlipidemia, who recently was admitted  for a syncopal episode.  She had a Cardiolite scan which was negative  for ischemia while hospitalized.  She did subsequently wear an  outpatient monitor revealing frequent PVCs with short runs of SVT, a  rate of 130, as well as a 2.6-second pause during sinus rhythm with a  significant bradycardia down into the 20s.  She is now referred for a  dual-chamber pacer for treatment of sick sinus syndrome.   DESCRIPTION OF OPERATION:  After giving informed written consent, the  patient was brought to the cardiac cath lab where the left chest was  prepped and draped in a sterile fashion.  Anesthesia monitoring was  established.  Lidocaine 1% was used to anesthetize the left mid  subclavicular area.  Next an approximately 3-cm transverse mid left  subclavicular incision was performed and hemostasis obtained with  cautery.  Blunt dissection used to carry this down to the pectoral  fascia.  Next an approximately 3 x 4-cm pocket was then created over the  pectoral fascia and again hemostasis was obtained with electrocautery.  The left subclavian vein was then entered x2 with two retained  guidewires.  A 4-0 silk suture was then placed at the base of the  retained  guidewires.  With the first retained guidewire, a 7-French  dilator and sheath were then easily tracked into the left subclavian  vein and the dilator and guide were removed.  Through the first sheath a  52-cm passive St. Jude lead, model number [id number], serial number is  [id number] __________ .Over the second retained guidewire a second 7-  [place] dilator and sheath were then easily tracked into left subclavian  vein and the dilator and guidewire were removed.  Through this a 46-cm  passive St. Jude lead, model number 1642T.,serial number [id number]  then passed into the right atrium and the peel-away sheath was removed.  A J curve was then placed in the ventricular lead stylet and the  ventricular lead was allowed to pass through the tricuspid valve and  position in the RV apex.  Thresholds were then determined.  R-waves  measured 12.3 mV.  Impedance 975 ohms.  Threshold in the ventricle was  0.5 V at 0.5 msec.  Current was 0.6 mA.  The atrial lead was then  positioned in the  right atrial appendage and the threshold determined.  P waves were measured at 5.4 mV.  Impedance 540 ohms.  Threshold was 0.5  V at 0.5 msec.  Current was 0.7 mA.  These leads were then this sutured  in place with two silk sutures per lead anchoring these to the pectoral  fascia.  Ten volts was negative for diaphragmatic stimulation.  The  leads were then connected in a serial fashion to a St. Jude Zephyr XL DR  generator.  Head screws were tightened and pacing was confirmed.  A  single silk suture was then placed in the superior aspect of pocket.  The pocket was then copiously irrigated with 1% kanamycin solution and  again hemostasis was confirmed.  The generator leads were then delivered  to the pocket and the header was secured to the silk suture.  The  subcutaneous layer was then closed using running 2-0 Vicryl.  The skin  was then closed using running 4-0 Vicryl.  Steri-Strips applied.  The  patient returned to  the recovery room in stable condition.   CONCLUSIONS:  Successful implant of a St. Jude Cypher XL DR generator,  serial number [id number], model number 5826, with passive atrial and  ventricular leads.      Darlin Priestly, MD  Electronically Signed     RHM/MEDQ  D:  04/13/2007  T:  04/13/2007  Job:  [id number]   cc:   Madelin Rear. Sherwood Gambler, MD  Richard A. Alanda Amass, M.D.

## 2010-12-18 NOTE — Procedures (Signed)
NAME:  [name], HUTMACHER               ACCOUNT NO.:  [id number]   MEDICAL RECORD NO.:  [id number]          PATIENT TYPE:  INP   LOCATION:  A219                          FACILITY:  [facility]   PHYSICIAN:  Dani Gobble, MD       DATE OF BIRTH:  1929-11-29   DATE OF PROCEDURE:  04/01/2007  DATE OF DISCHARGE:                                  STRESS TEST   REFERRING PHYSICIAN:  Richard A. Alanda Amass, M.D.   INDICATIONS:  Syncope.   ELECTROCARDIOGRAPHIC/HEMODYNAMIC DATA:  Baseline blood pressure 132/70  mmHg with a pulse of 56 beats per minute.  Baseline 12-lead EKG reveals  sinus bradycardia with a prior inferior infarct but no acute ischemic  changes noted.   The patient walked for 6 minutes and 1 second on a full Bruce protocol.  She met her target heart rate of 124 beats per minute.  Her peak blood  pressure was 188/68 mmHg.  She had no chest discomfort or a any  suggestion of claudication.  She did have some mild dizziness at peak  heart rate, but no presyncope or syncope.  EKG revealed sinus  tachycardia without ischemic changes noted.  She had some enhancement of  nonspecific ST waves inferiorly but no ischemic changes on the EKG.  She  had an occasional PVC.   There were no additional changes during the recovery phase.   IMPRESSION:  1. Clinically negative for angina.  2. EKG negative for ischemia.  3. Diminished overall exercise tolerance.  4. Scintigraphic images are pending.           ______________________________  Dani Gobble, MD     AB/MEDQ  D:  04/01/2007  T:  04/01/2007  Job:  [id number]   cc:   Gerlene Burdock A. Alanda Amass, M.D.  Fax: [phone]

## 2010-12-18 NOTE — Procedures (Signed)
[name], LANDRY               ACCOUNT NO.:  [id number]   MEDICAL RECORD NO.:  [id number]          PATIENT TYPE:  INP   LOCATION:  A219                          FACILITY:  [facility]   PHYSICIAN:  Dani Gobble, MD       DATE OF BIRTH:  Aug 11, 1929   DATE OF PROCEDURE:  03/30/2007  DATE OF DISCHARGE:                                ECHOCARDIOGRAM   REFERRING:  Dr. Elige Radon, and Dr. Alanda Amass at [place].   INDICATIONS:  Syncope.   The technical quality of the study is quite limited due to patient body  habitus and poor acoustic windows.   The aorta subjectively appears to be normal in size.   The left atrium also is normal in size.  No obvious clots or masses were  appreciated.  However, this cannot be entirely excluded due to the  technical difficulty of the study.  The patient appeared to be in sinus  rhythm during this procedure.   The interventricular septum and posterior wall were not well delineated  but grossly appeared to be normal in thickness.   The aortic valve was not well visualized.  Doppler interrogation does  not suggest aortic stenosis.  No obvious aortic insufficiency was noted  but again, cold-flow Doppler was suboptimal.   The mitral valve grossly appeared to be normal.  No definitive mitral  regurgitation was noted, but Doppler interrogation was suboptimal.  Doppler interrogation of the mitral valve appeared to be within normal  limits.   The pulmonic valve was not visualized.   The tricuspid valve was not visualized.   The left ventricle subjectively appeared to be normal in size.  Overall  left ventricular systolic function appear to be normal.  The endocardium  was not well visualized but having said that, no obvious regional wall  motion abnormalities were noted.   The right atrium and right ventricle were normal in size and right  ventricular systolic function appeared to be normal.   IMPRESSION:  1. Technically limited study secondary to  patient body habitus and      poor acoustic windows.  2. The valves were not well visualized, but no obvious valvular      pathology was appreciated.  3. Normal left size and systolic function without obvious regional      wall motion abnormalities, although the endocardium was not well      visualized so the possibility of this cannot be entirely excluded      on this study.           ______________________________  Dani Gobble, MD     AB/MEDQ  D:  04/01/2007  T:  04/01/2007  Job:  [id number]   cc:   Dr. Kingsley Callander A. Alanda Amass, M.D.  Fax: [phone]

## 2010-12-18 NOTE — Group Therapy Note (Signed)
NAME:  [name], [name]               ACCOUNT NO.:  [id number]   MEDICAL RECORD NO.:  [id number]          PATIENT TYPE:  INP   LOCATION:  A219                          FACILITY:  [facility]   PHYSICIAN:  Dorris Singh, DO    DATE OF BIRTH:  11/12/1929   DATE OF PROCEDURE:  DATE OF DISCHARGE:                                 PROGRESS NOTE   PROGRESS NOTE:  Patient was seen today in bed.  Had just had a nuclear  stress done.  Spoke with Dr.Bradsher of [facility].  Will  wait to have results for that.  These are pending.  If patient has a  positive test, will consider transferring her to [facility] for a cardiac cath  or, if not, will consider pacemaker placement per conversation with the  cardiologist.   PHYSICAL EXAMINATION:  VITAL SIGNS:  Her vitals today are 97.8, pulse  62, respirations 20, blood pressure 154/75.  GENERAL:  This is a 77 year old Caucasian female who is up sitting,  eating lunch, has no complaints today.  She is well-nourished, well-  developed, in no acute distress.  HEART:  Regular rate and rhythm.  No rubs or gallops noted.  LUNGS:  Clear to auscultation bilaterally with diminished breath sounds.  ABDOMEN:  Soft, nontender, nondistended.  EXTREMITIES:  Positive pulses.  No edema, ecchymosis or cyanosis.   The labs that were done today include WBC of 6.9, hemoglobin 10.9,  hematocrit 32.0, platelets 209.  Her potassium is 3.4, sodium 144,  chloride 27, glucose 95, BUN 11, creatinine 0.55, calcium 8.3.   ASSESSMENT AND PLAN:  1. Hypokalemia.  Will go ahead and replace potassium with two runs of      10 mEq of potassium IV.  2. Syncopal episode, possibly secondary to carotid stenosis.  3. Head contusion.  Patient is continuing fine without any      complications.  4. Bradycardia, still persistent.  Awaiting results of Myoview.   PLAN:  Anticipation of discharge if everything is negative.  Based on  the tests patient may either be scheduled for cardiac cath or  possible  pacemaker placement, and await further instructions from cardiology.   We consulted neurology and [facility] to participate.      Dorris Singh, DO  Electronically Signed     CB/MEDQ  D:  04/01/2007  T:  04/02/2007  Job:  [id number]

## 2010-12-18 NOTE — Group Therapy Note (Signed)
NAME:  [name], [name]               ACCOUNT NO.:  [id number]   MEDICAL RECORD NO.:  [id number]          PATIENT TYPE:  OBV   LOCATION:  A219                          FACILITY:  [facility]   PHYSICIAN:  Dorris Singh, DO    DATE OF BIRTH:  1930-06-30   DATE OF PROCEDURE:  03/31/2007  DATE OF DISCHARGE:                                 PROGRESS NOTE   The patient was seen today resting comfortably in bed.  She had been  seen by neurology and cardiology and discussed plans with the patient.  She is still complaining of some dizziness, but states that she thinks  it is probably due to a cold.  I discussed with her that maybe I could  give her some nasal steroid to see if that would help with the  congestion and headache that she currently is having.  The patient  stated understanding of that.  Also she discussed the possibility of  having a stress test tomorrow, so we will continue to follow the patient  regarding that.   PHYSICAL EXAMINATION:  VITAL SIGNS:  Temperature 98.2, pulse 71,  respirations 14, blood pressure 149/64.  GENERAL:  This is a Caucasian female who is well-developed, well-  nourished, and in no acute distress.  HEENT:  Positive frontal sinus tenderness.  Eyes are EOMI and PERRLA.  HEART:  Regular rate and rhythm.  No murmurs, rubs, or gallops.  NECK:  No masses, full range of motion.  LUNGS:  Clear to auscultation bilaterally.  No wheezes, rales, or  rhonchi.  ABDOMEN:  Soft and nontender and nondistended.  No guarding or rebound  noted.   LABORATORY DATA:  The patient had a Doppler done yesterday which  demonstrated significant atherosclerotic plaque disease in bilateral  carotid systems.  Peak velocities are elevated in both ECA compatible  with stenosis.  Additionally elevated velocities left ICA corresponding  to 80-90% diameter.  Stenoses increased velocity right ICA corresponding  to a 50-69% diameter stenosis.   No labs were done today and we will order for  first thing in the  morning.   ASSESSMENT:  1. Syncopal episode.  2. Bilateral carotid artery stenosis.  3. Head contusion.  4. Headache.  5. Bradycardia.  6. Gastroesophageal reflux disease.   PLAN:  1. The patient is being currently seen by cardiology and neurology.      The patient has a stress test scheduled for tomorrow.  At that      point in time based on the results we will have the patient follow      up outpatient for any      other tests and procedures that need to be done once surgery and      cardiology sign off on the patient.  2. Also look for headache.  We will continue with NSAIDS as well as      nasal steroid to see if it helps the patient with current URI.  We      will continue to monitor the patient closely at this point in time.      Dorris Singh,  DO  Electronically Signed     CB/MEDQ  D:  03/31/2007  T:  03/31/2007  Job:  [id number]

## 2010-12-18 NOTE — Discharge Summary (Signed)
NAME:  [name], [name]               ACCOUNT NO.:  [id number]   MEDICAL RECORD NO.:  [id number]          PATIENT TYPE:  INP   LOCATION:  A219                          FACILITY:  [facility]   PHYSICIAN:  Dorris Singh, DO    DATE OF BIRTH:  Aug 22, 1929   DATE OF ADMISSION:  03/29/2007  DATE OF DISCHARGE:  08/29/2008LH                               DISCHARGE SUMMARY   ADMISSION DIAGNOSES:  1. Syncopal episode, etiology unknown.  2. Head contusion.  3. Hypertension.  4. Bradycardia.  5. Gastroesophageal reflux.   DISCHARGE DIAGNOSES:  1. Syncopal episode.  2. Bilateral carotid artery stenosis.  3. Bradycardia.  4. Head contusion.  5. Headache.  6. Gastroesophageal reflux.   CONSULTS:  The patient was seen by Dr. Alanda Amass and Dr. Domingo Sep of  [facility] as well as Dr. Gerilyn Pilgrim.   PROCEDURES:  She had done a CT angiogram of the chest which demonstrated  no evidence of pulmonary edema with a right centimeter nodule in the  right lower lobe. They recommended a CT in 6 months and she can have  that done with her primary care and ultrasound of the carotid Doppler  demonstrated blockages in both carotid arteries with a stenosis  bilaterally. CT of the head without contrast demonstrated a scalp  hematoma on the left side as well as a possible suspicion for  hemorrhagic contusion on the left side and atrophy and microvascular  ischemic changes.  She also had a chest x-ray which showed stable  chronic COPD.  She had a myocardial perfusion test which showed probably  apical thinning, no definite myocardial perfusion abnormalities, normal  LVEF of 63% and normal wall motion. She had multiple views and her  history and physical please refer to that in chart.  The patient was  seen by Dr. Gerilyn Pilgrim who have recommended orthostatics to see if the  syncopal episode could be reproduced. The patient did not have any other  syncopal episodes.  She is being treated for a headache.  Also she  had a  carotid Doppler that was ordered as well as a 2-D echo on August 25 and  the 2-D echo demonstrated the valves were not well visualized.  Normal  left side of the heart and normal systolic function, however, the  endocardium was not well visualized. On the 26th orthostatics were  repeated.  The patient was then seen by Dr. Domingo Sep who recommended a  stress Myoview on August 27 that was completed. Also the results of her  carotid Doppler came in which confirmed bilateral carotid stenosis. At  that point in time after discussion with Dr. Domingo Sep, it was determined  that the patient could be discharged on the 28th with follow up with  her. She wanted the patient to come from the hospital directly to her  office to set her up with a Holter monitor to determine if the patient  needed to have pacemaker placement.  The patient agreed and stated  understanding. Also due to cardiac problems felt that the syncopal  episodes were probably because of the carotid stenosis and the patient  stated an understanding of that. At this point in time, the patient was  discharged home with specific instructions to follow up with Dr.  Roque Lias office at [facility] immediately after  discharge. She was discharged on the following medications:   DISCHARGE MEDICATIONS:  1. ASA 81 mg.  2. Plavix 75 mg.  3. Lotrel 10/20 mg.  4. Lipitor 20 mg p.o.  5. Nexium 40 mg p.o.  6. __________ 8 mg twice a day.   No new medications were added and the patient was instructed to follow  up with her primary care physician in 1 week.      Dorris Singh, DO  Electronically Signed     CB/MEDQ  D:  04/02/2007  T:  04/03/2007  Job:  [id number]   cc:   Patrica Duel, M.D.  Fax: [phone]

## 2010-12-19 ENCOUNTER — Encounter: Payer: Self-pay | Admitting: Orthopedic Surgery

## 2010-12-19 ENCOUNTER — Ambulatory Visit (INDEPENDENT_AMBULATORY_CARE_PROVIDER_SITE_OTHER): Payer: Medicare HMO | Admitting: Orthopedic Surgery

## 2010-12-19 DIAGNOSIS — M629 Disorder of muscle, unspecified: Secondary | ICD-10-CM

## 2010-12-19 DIAGNOSIS — M7632 Iliotibial band syndrome, left leg: Secondary | ICD-10-CM | POA: Insufficient documentation

## 2010-12-19 MED ORDER — METHYLPREDNISOLONE ACETATE 40 MG/ML IJ SUSP: 40.0000 mg | Freq: Once | INTRAMUSCULAR | Status: DC

## 2010-12-19 NOTE — Progress Notes (Signed)
Diagnosis iliotibial band syndrome.  Status post 2 injections, lateral knee for lateral knee pain.  Recently, complaining of some lumbar discomfort as well. Inability to sit for long periods or stand for long periods.  Question whether she is having some radicular symptoms as well.  Tenderness over the lateral epicondyle of the femur and iliotibial band with mild swelling.  Repeat injection.  Followup one month.  Expect patient to be out of work for one year because she cannot stand or sit.

## 2010-12-19 NOTE — Patient Instructions (Signed)
OOW x 7 more months

## 2010-12-19 NOTE — Progress Notes (Signed)
Injection LEFT knee.  Consent was obtained.  Time out was taken   LEFT knee ITB was injected with Depo-Medrol 40 mg plus lidocaine 1% 4 cc.  Knee was prepped with alcohol and anesthetized with ethyl chloride.  The injection was tolerated without complication.

## 2010-12-21 NOTE — Discharge Summary (Signed)
[name], [name]               ACCOUNT NO.:  [id number]   MEDICAL RECORD NO.:  [id number]          PATIENT TYPE:  INP   LOCATION:  4733                         FACILITY:  [facility]   PHYSICIAN:  Darlin Priestly, MD  DATE OF BIRTH:  04/08/1930   DATE OF ADMISSION:  04/13/2007  DATE OF DISCHARGE:  04/14/2007                               DISCHARGE SUMMARY   DISCHARGE DIAGNOSES:  1. Status post dual-chamber paced, dual-chamber sensed, dual response,      rate modulated device (DDDR) pacemaker insertion.  2. Syncope secondary to bradycardia.  3. Peripheral arterial disease.  4. Coronary artery disease.  5. Hypertension.  6. Dyslipidemia.   HISTORY OF PRESENT ILLNESS:  [name] is a 77 year old female with  history of peripheral arterial disease as well as coronary disease who  was admitted to [facility] in August 2008, after experiencing a  syncopal episode while sitting on a stool at home.  She suffered a left  parietooccipital hematoma from her fall and was admitted.  During her  hospitalization, there was two episodes of bradycardia on telemetry  which can consisted of 2 second pauses.  Upon discharge from the  hospital, she wore a Holter monitor which revealed some short runs of  SVT as well as pauses of 2.62 seconds as well as bradycardia into the  30s.  Because of these findings, it was felt best that she undergo  pacemaker placement in order to prevent these episodes of syncope.  Please see complete history and physical for further details.   HOSPITAL COURSE:  [name] was admitted on April 13, 2007, and taken  to the cardiac catheterization suite for placement of a DDDR pacer for  sick sinus syndrome and symptomatic bradycardia with syncope performed  by Dr. Jenne Campus and Dr. Lynnea Ferrier.  She tolerated the procedure well  without complications.  She received a [employer] DR [phone] which is a Physicist, medical.  She tolerated the procedure without complication and  was admitted to 6500 in stable condition.  She did complain of mild  discomfort at the pacer site, however, there was no redness swelling or  erythema at the site.   DIET:  Heart-healthy.   WOUND CARE:  She was given a pacemaker instruction discharge sheet with  specific wound care instructions outlined.   ACTIVITY:  She was given a pacemaker discharge instruction sheet which  thoroughly outlined her activity.   FOLLOW UP:  She is to return to the [place] office for incision check  in 1 week.  She was to return to Dr. Alanda Amass for pacer check and  follow up in 4-6 weeks.  Our office was contacted and they will call her  with specific date and times for her appointment.   DISCHARGE MEDICATIONS:  1. Plavix 75 mg daily.  She is to restart that on April 15, 2007.  2. Aspirin 81 mg daily.  She is to start that on April 15, 2007.  3. Calcium 600 mg daily.  4. Lipitor 20 mg daily.  5. Nexium 40 mg daily.  6. Nasonex daily.  7. [employer] daily.  8. Darvocet-N 100 1-2 tablets every 4-6 hours as needed for pain.  9. Toprol XL 25 mg daily.  10.Lotrel as previously prescribed.     ______________________________  Melissa Muff, NP      Darlin Priestly, MD  Electronically Signed    LS/MEDQ  D:  08/19/2007  T:  08/20/2007  Job:  [id number]   cc:   Gerlene Burdock A. Alanda Amass, M.D.

## 2010-12-21 NOTE — Cardiovascular Report (Signed)
NAME:  [name], HENG NO.:  [id number]   MEDICAL RECORD NO.:  [id number]                   PATIENT TYPE:  OIB   LOCATION:  2899                                 FACILITY:  [facility]   PHYSICIAN:  Nanetta Batty, MD                  DATE OF BIRTH:  [birth year]   DATE OF PROCEDURE:  DATE OF DISCHARGE:                              CARDIAC CATHETERIZATION   CLINICAL HISTORY:  The patient is a delightful 77 year old widowed white  female mother of 6, grandmother to 6 grandchildren, who works part-time at  the [employer] in [place].  She has a history of tobacco  abuse, hypertension, hyperlipidemia.  She has had claudication with Dopplers  that revealed moderate decrease in her ABIs bilaterally.  She presents now  for aortography and bifemoral runoff with potential endovascular treatment.   DESCRIPTION OF PROCEDURE:  The patient was brought to the sixth floor [facility] Suite in the postabsorptive state.  She was premedicated with p.o. Valium and IV Versed.  Her left groin was  prepped and shaved in the usual sterile fashion.  Xylocaine, 1%, was used  for local anesthesia.  A #5 French sheath was inserted into the left femoral  artery using standard Seldinger technique.  A #5 [place] tennis racquet  catheter was used for midstream and distal abdominal aortography as well as  bifemoral runoff.  A pullback gradient was obtained across the aortoiliac  trifurcation as well as the left external iliac artery using an endhole  catheter.  Visipaque dye was used for the entirety of the case.  Retrograde  aortoiliac pressures were monitored during the case.   ANGIOGRAPHIC RESULTS:  1. Abdominal aorta     A. Renal arteries--normal.     B. Infrarenal abdominal aorta:  Diffusely diseased with 50% narrowing at        the aortoiliac trifurcation.  There was no pullback gradient noted.  2. Left lower extremity     A. A 70-80%  segmental left external iliac artery stenosis without any        pullback gradient.     B. A 70-80% diffusely diseased superficial femoral artery with two-vessel        runoff.  Posterior tibialis appeared to be occluded and there appeared        to be high-grade disease in the tibial peroneal trunk.  3. Right lower extremity:  A 70-80% diffuse disease in the superficial     femoral artery with two-vessel runoff.   IMPRESSION:  The patient has diffuse infrainguinal disease, most likely not  amenable to endovascular or surgical revascularization.  Medical therapy  will be recommended and further risk factor modification.   The sheaths were removed and pressure was held on the groin to achieve  hemostasis.  She left the lab in stable condition.  She will be hydrated  initially and  discharged home later today as an outpatient.  She will see me  back in the office in approximately 2-3 weeks.  We will begin Pletal 100 mg  p.o. b.i.d.  Dr. Artis Delay was notified of these results.                                               Nanetta Batty, MD    JB/MEDQ  D:  05/13/2002  T:  05/16/2002  Job:  [id number]   cc:   Peripheral Vascular Angiographic Suite   [facility]   Madelin Rear. Sherwood Gambler, M.D.  [address]  [place]  [place] [address]  Fax: [phone]

## 2010-12-24 ENCOUNTER — Emergency Department (HOSPITAL_COMMUNITY)
Admission: EM | Admit: 2010-12-24 | Discharge: 2010-12-25 | Disposition: A | Discharge status: Home / Self Care | Payer: Medicare HMO | Attending: Emergency Medicine | Admitting: Emergency Medicine

## 2010-12-24 DIAGNOSIS — Z7902 Long term (current) use of antithrombotics/antiplatelets: Secondary | ICD-10-CM | POA: Insufficient documentation

## 2010-12-24 DIAGNOSIS — R112 Nausea with vomiting, unspecified: Secondary | ICD-10-CM | POA: Insufficient documentation

## 2010-12-24 DIAGNOSIS — R197 Diarrhea, unspecified: Secondary | ICD-10-CM | POA: Insufficient documentation

## 2010-12-24 DIAGNOSIS — K219 Gastro-esophageal reflux disease without esophagitis: Secondary | ICD-10-CM | POA: Insufficient documentation

## 2010-12-24 DIAGNOSIS — I10 Essential (primary) hypertension: Secondary | ICD-10-CM | POA: Insufficient documentation

## 2010-12-24 DIAGNOSIS — I499 Cardiac arrhythmia, unspecified: Secondary | ICD-10-CM | POA: Insufficient documentation

## 2010-12-24 DIAGNOSIS — E785 Hyperlipidemia, unspecified: Secondary | ICD-10-CM | POA: Insufficient documentation

## 2010-12-24 DIAGNOSIS — Z79899 Other long term (current) drug therapy: Secondary | ICD-10-CM | POA: Insufficient documentation

## 2010-12-25 LAB — BASIC METABOLIC PANEL
BUN: 30 mg/dL — ABNORMAL HIGH (ref 6–23)
CO2: 28 mEq/L (ref 19–32)
Calcium: 10.6 mg/dL — ABNORMAL HIGH (ref 8.4–10.5)
Chloride: 100 mEq/L (ref 96–112)
Creatinine, Ser: 1.13 mg/dL (ref 0.4–1.2)

## 2010-12-25 LAB — URINALYSIS, ROUTINE W REFLEX MICROSCOPIC
Protein, ur: NEGATIVE mg/dL
pH: 5 (ref 5.0–8.0)

## 2011-01-22 ENCOUNTER — Encounter: Payer: Self-pay | Admitting: Orthopedic Surgery

## 2011-01-22 ENCOUNTER — Ambulatory Visit (INDEPENDENT_AMBULATORY_CARE_PROVIDER_SITE_OTHER): Payer: Medicare HMO | Admitting: Orthopedic Surgery

## 2011-01-22 DIAGNOSIS — M549 Dorsalgia, unspecified: Secondary | ICD-10-CM

## 2011-01-22 DIAGNOSIS — M629 Disorder of muscle, unspecified: Secondary | ICD-10-CM

## 2011-01-22 DIAGNOSIS — M1710 Unilateral primary osteoarthritis, unspecified knee: Secondary | ICD-10-CM

## 2011-01-22 MED ORDER — ACETAMINOPHEN-CODEINE #3 300-30 MG PO TABS: 1.0000 | ORAL_TABLET | Freq: Four times a day (QID) | ORAL | Status: AC | PRN

## 2011-01-22 NOTE — Progress Notes (Signed)
   Follow up visit.  Diagnosis LEFT knee pain secondary to iliotibial band tendinitis, arthritis.  Also complains of lower back pain.  X-ray of the lower back, will be done today.  Patient currently out on extended disability leave  Previous treatments include multiple injections and physical therapy. Medial compartment joint space narrowing and mild and some spurs near the patella.In  We went ahead and took a lumbar spine film, which shows degenerative scoliosis with degenerative joint disease, and disc disease.  Recommend physical therapy for scoliosis and lumbar back pain and degenerative disc disease. Recommend Tylenol with Codeine for pain, knee or back.  Followup in one month

## 2011-01-22 NOTE — Progress Notes (Signed)
AP lateral, spot films 3 views, lumbar spine x-ray report.  Reason for x-ray, back pain.  Alignment is abnormal in the coronal plane with coronal scoliosis in the lumbar area. Joint space narrowing as seen at L5 and S1 mildly at L4 and L5 with facet joint arthritis at these levels. There is generalized osteopenia, There's compensatory mild kyphosis.  Impression degenerative disc disease with degenerative scoliosis

## 2011-02-11 ENCOUNTER — Ambulatory Visit (HOSPITAL_COMMUNITY)
Admission: RE | Admit: 2011-02-11 | Discharge: 2011-02-11 | Disposition: A | Discharge status: Home / Self Care | Payer: Medicare HMO | Source: Ambulatory Visit | Attending: Orthopedic Surgery | Admitting: Orthopedic Surgery

## 2011-02-11 DIAGNOSIS — M25569 Pain in unspecified knee: Secondary | ICD-10-CM | POA: Insufficient documentation

## 2011-02-11 DIAGNOSIS — M549 Dorsalgia, unspecified: Secondary | ICD-10-CM | POA: Insufficient documentation

## 2011-02-11 DIAGNOSIS — M6281 Muscle weakness (generalized): Secondary | ICD-10-CM | POA: Insufficient documentation

## 2011-02-11 DIAGNOSIS — R262 Difficulty in walking, not elsewhere classified: Secondary | ICD-10-CM | POA: Insufficient documentation

## 2011-02-11 DIAGNOSIS — IMO0001 Reserved for inherently not codable concepts without codable children: Secondary | ICD-10-CM | POA: Insufficient documentation

## 2011-02-11 NOTE — Progress Notes (Signed)
Physical Therapy Evaluation Patient Name: [name] Date: 02/11/2011 HPI: Symptoms/Limitations Symptoms: Radiating pain into R LE below knee level Limitations: Standing;Walking;House hold activities;Sitting How long can you sit comfortably?: 30 minutes How long can you stand comfortably?: 20 minutes How long can you walk comfortably?: 3 hours Repetition: Increases Symptoms (flexion) Pain Assessment Currently in Pain?: Yes Pain Score:   5 Pain Location: Back Pain Orientation: Right Pain Type: Neuropathic pain Pain Radiating Towards: R knee Pain Onset: More than a month ago Pain Frequency: Several days a week Pain Relieving Factors: pain med Effect of Pain on Daily Activities: Patient states she does everything she wants to do she just has increased pain while doing theim. Past Medical History: No past medical history on file. Past Surgical History:  Past Surgical History  Procedure Date  . Total abdominal hysterectomy w/ bilateral salpingoophorectomy   . Pacemaker surgery   . Polyps in throat     Precautions/Restrictions     Prior Functioning  Home Living Type of Home: House Lives With: Daughter Prior Function Level of Independence: Independent with basic ADLs Driving: Yes Able to Take Stairs Reciprically: Yes Vocation: On disability Leisure: Hobbies-yes (Comment) Comments: shopping  Cognition Cognition Overall Cognitive Status: Appears within functional limits for tasks assessed Arousal/Alertness: Awake/alert Orientation Level: Oriented X4  Sensation/Coordination/Flexibility    Assessment LUE AROM (degrees) Overall AROM Left Upper Extremity: Within functional limits for tasks assessed RLE Strength Right Hip Flexion: 3+/5 Right Hip Extension: 3/5 Right Hip ABduction: 2/5 Right Hip ADduction: 3/5 Right Knee Flexion: 5/5 Right Knee Extension: 3+/5 Right Ankle Dorsiflexion: 4/5 Right Ankle Plantar Flexion: 4/5 LLE Assessment LLE Assessment:  Within Functional Limits Lumbar AROM Lumbar Flexion: decreased 30 % Lumbar Extension:  (wnl) Lumbar - Right Side Bend: decreased 20% Lumbar - Left Side Bend: decreased 20% Lumbar - Right Rotation: decreased 25% Lumbar - Left Rotation: decreased 25%  Mobility (including Balance)       Exercise/Treatments @FLOW ([id number])@  Goals PT Short Term Goals Short Term Goal 1: I HEP Short Term Goal 2: Pain level no greater than a 2/10 to allow patient to sleep without waking Short Term Goal 3: Patient to state that pain has not readiated past upper thigh PT Long Term Goals Long Term Goal 1: I Advanced HEP Long Term Goal 2: Patient able to complete four hours of activity without any radicular pain End of Session Patient Active Problem List  Diagnoses  . KNEE, ARTHRITIS, DEGEN./OSTEO  . ANSERINE BURSITIS, LEFT  . ILIOTIBIAL BAND SYNDROME, LEFT KNEE  . Iliotibial band syndrome of left side  . Back pain  . Backache, unspecified  . Muscle weakness (generalized)   PT - End of Session Activity Tolerance: Patient tolerated treatment well General Behavior During Session: WFL for tasks performed Cognition: [facility] for tasks performed PT Assessment and Plan Clinical Impression Statement: Patient with signs and symptoms of posterior derangement with R LE weakness and radiating pain Rehab Potential: Good PT Frequency: Min 3X/week PT Duration: 6 weeks PT Treatment/Interventions: Therapeutic exercise;Other (comment);Patient/family education PT Plan: see patient for stretching and strengthening exercises as well as manual techniques for tight paraspinal mm   RUSSELL,CINDY 02/11/2011, 5:23 PM

## 2011-02-11 NOTE — Patient Instructions (Signed)
HEP; bed mobility, body mechanics

## 2011-02-13 ENCOUNTER — Encounter (HOSPITAL_COMMUNITY): Payer: Self-pay | Admitting: Physical Therapy

## 2011-02-18 ENCOUNTER — Ambulatory Visit (HOSPITAL_COMMUNITY)
Admission: RE | Admit: 2011-02-18 | Discharge: 2011-02-18 | Disposition: A | Discharge status: Home / Self Care | Payer: Medicare HMO | Source: Ambulatory Visit | Attending: Family Medicine | Admitting: Family Medicine

## 2011-02-18 NOTE — Progress Notes (Signed)
Physical Therapy Treatment Patient Name: [name] ZOXWR'U Date: 02/18/2011  Visit #: 2/2  Time In:  9:52 Time Out: 10:20  HPI: Symptoms/Limitations Symptoms: Radiating pain in RLE Pain Assessment Currently in Pain?: Yes Pain Score:   4 Pain Location: Knee Pain Orientation: Right;Lateral Pain Type: Neuropathic pain Pain Radiating Towards: Right knee Pain Frequency: Intermittent Pain Relieving Factors: Laying down   Exercise/Treatments Lumbar Stretches Active Hamstring Stretch: 3 reps;30 seconds Single Knee to Chest Stretch: 3 reps;30 seconds Stability Exercises Clam: 10 reps;Supine Bridge: 5 reps;Supine;3 seconds Bent Knee Raise: 10 reps Ab Set: 10 reps;5 seconds Functional Squats: 10 reps Lumbar Machine Exercises Tread Mill: 3'@1 .2 Hip Stretches Active Hamstring Stretch: 3 reps;30 seconds Additional Hip Exercises Tread Mill: 3'@1 .2 Knee Stretches Active Hamstring Stretch: 3 reps;30 seconds Balance Exercises Tread Mill: 3'@1 .2    Goals PT Short Term Goals Short Term Goal 1 Progress: Met Short Term Goal 2 Progress: Progressing toward goal Short Term Goal 3 Progress: Progressing toward goal PT Long Term Goals Long Term Goal 1 Progress: Progressing toward goal Long Term Goal 2 Progress: Progressing toward goal End of Session Patient Active Problem List  Diagnoses  . KNEE, ARTHRITIS, DEGEN./OSTEO  . ANSERINE BURSITIS, LEFT  . ILIOTIBIAL BAND SYNDROME, LEFT KNEE  . Iliotibial band syndrome of left side  . Back pain  . Backache, unspecified  . Muscle weakness (generalized)   PT - End of Session Activity Tolerance: Patient tolerated treatment well General Behavior During Session: [facility] for tasks performed Cognition: [facility] for tasks performed PT Assessment and Plan Clinical Impression Statement: Pt completes therex without difficulty. REquires VCs to stabilize abdominals with therex. Rehab Potential: Good PT Frequency: Min 3X/week PT Duration: 6  weeks PT Plan: Continue per PT POC. Continue to progress strength and flexibility.   Seth Bake [facility] 02/18/2011, 10:24 AM

## 2011-02-20 ENCOUNTER — Ambulatory Visit (HOSPITAL_COMMUNITY)
Admission: RE | Admit: 2011-02-20 | Discharge: 2011-02-20 | Disposition: A | Discharge status: Home / Self Care | Payer: Medicare HMO | Source: Ambulatory Visit | Attending: Family Medicine | Admitting: Family Medicine

## 2011-02-20 NOTE — Progress Notes (Signed)
Physical Therapy Treatment Patient Name: [name] ZOXWR'U Date: 02/20/2011  Visit # :3/3 Initial Evaluation Date:02/11/11 Charges:Therex 15'   HPI: Symptoms/Limitations Symptoms: Back is hurting some today Pain Assessment Currently in Pain?: Yes Pain Score:   4 Pain Location: Back Pain Orientation: Lower Pain Frequency: Intermittent  Exercise/Treatments Lumbar Stretches Active Hamstring Stretch: 3 reps;30 seconds Single Knee to Chest Stretch: 3 reps;30 seconds Stability Exercises Clam: 15 reps;Supine Bridge: Supine;3 seconds;10 reps;5 seconds Bent Knee Raise: 10 reps;Supine (w/ab set) Ab Set: 10 reps;5 seconds Leg Raise: Right;Left;10 reps (supine "SLR") Functional Squats: 15 reps Heel Raises: 10 reps Lumbar Machine Exercises Tread Mill: 3'@1 .0 Hip Stretches Active Hamstring Stretch: 3 reps;30 seconds Additional Hip Exercises Tread Mill: 3'@1 .0 Knee Stretches Active Hamstring Stretch: 3 reps;30 seconds Knee Exercises Heel Raises: 10 reps Ankle Exercises Heel Raises: 10 reps Toe Raise: 10 reps Balance Exercises Tread Mill: 3'@1 .0 Heel Raises: 10 reps Toe Raise: 10 reps (Each exercise done once, repeated exercise due to computer error)  Goals PT Short Term Goals Short Term Goal 1 Progress: Met Short Term Goal 2 Progress: Progressing toward goal Short Term Goal 3 Progress: Progressing toward goal Short Term Goal 4 Progress: Progressing toward goal PT Long Term Goals Long Term Goal 1 Progress: Progressing toward goal Long Term Goal 2 Progress: Progressing toward goal End of Session Patient Active Problem List  Diagnoses  . KNEE, ARTHRITIS, DEGEN./OSTEO  . ANSERINE BURSITIS, LEFT  . ILIOTIBIAL BAND SYNDROME, LEFT KNEE  . Iliotibial band syndrome of left side  . Back pain  . Backache, unspecified  . Muscle weakness (generalized)   PT - End of Session Activity Tolerance: Patient tolerated treatment well General Behavior During Session: [facility] for  tasks performed Cognition: [facility] for tasks performed PT Assessment and Plan Clinical Impression Statement: PT completes therex with increased ease. Pt continues to requires VC/TC for abdominal stabilization with therex. PT Treatment/Interventions: Therapeutic exercise PT Plan: Continue per PT POC. Begin SL bilateral hip abdduction and clams next tx.   Seth Bake [facility] 02/20/2011, 9:51 AM

## 2011-02-21 ENCOUNTER — Encounter: Payer: Self-pay | Admitting: Orthopedic Surgery

## 2011-02-21 ENCOUNTER — Ambulatory Visit (INDEPENDENT_AMBULATORY_CARE_PROVIDER_SITE_OTHER): Payer: Medicare HMO | Admitting: Orthopedic Surgery

## 2011-02-21 DIAGNOSIS — M5137 Other intervertebral disc degeneration, lumbosacral region: Secondary | ICD-10-CM

## 2011-02-21 DIAGNOSIS — M25562 Pain in left knee: Secondary | ICD-10-CM

## 2011-02-21 DIAGNOSIS — M25561 Pain in right knee: Secondary | ICD-10-CM

## 2011-02-21 DIAGNOSIS — M25569 Pain in unspecified knee: Secondary | ICD-10-CM

## 2011-02-21 DIAGNOSIS — M5136 Other intervertebral disc degeneration, lumbar region: Secondary | ICD-10-CM | POA: Insufficient documentation

## 2011-02-21 NOTE — Progress Notes (Signed)
   Follow-up visit  #1 LEFT knee pain #2 degenerative disc disease lumbar spine #3 new RIGHT knee pain  Problem #1 LEFT knee pain: The patient reports her LEFT knee has improved.  She says it only hurts when she is doing therapy and her leg is raised and air.  Problem #2 degenerative disc disease lumbar spine currently treated with physical therapy.  The patient says this is improving her lumbar spine condition.  There is some concern that the pain in her LEFT knee and lateral leg is coming from her back so this is encouraging  Problem #3 patient went to [place] visit visit her son she fell over the dog complains of moderate aching constant lateral knee pain over the RIGHT fibula.  The exam shows tenderness over the fibula normal range of motion, negative McMurray sign no joint effusion all ligaments stable muscle tone normal skin intact neurovascular exam normal  ROS: normal neuro and msk except as stated   History reviewed. No pertinent past medical history.   Radiographs: RIGHT knee: Separate x-ray report 3 views RIGHT knee  Reason for x-ray spell lateral knee pain  There is diffuse osteopenia a [place] at looks fairly normal no fracture is seen.  Joint spaces and preserved.  Impression normal alignment and joint space with osteopenia  Impression  Problem #1 contusion RIGHT knee Problem #2 degenerative disc disease improved Problem #3 LEFT knee improved  There is some discrepancy on appointments reference to the LEFT knee from the patient's disability carrier who is requesting according to her monthly appointments.  However, I do not feel this is necessary.  We will check into this and make further recommendations

## 2011-02-21 NOTE — Patient Instructions (Signed)
Finish physical therapy   Wait to hear from ref next visit left knee

## 2011-02-22 ENCOUNTER — Ambulatory Visit (HOSPITAL_COMMUNITY)
Admission: RE | Admit: 2011-02-22 | Discharge: 2011-02-22 | Disposition: A | Discharge status: Home / Self Care | Payer: Medicare HMO | Source: Ambulatory Visit | Attending: Family Medicine | Admitting: Family Medicine

## 2011-02-22 NOTE — Patient Instructions (Signed)
Correct stabilization.

## 2011-02-22 NOTE — Progress Notes (Signed)
Physical Therapy Treatment Patient Name: SHAKENYA STONEBERG JYNWG'N Date: 02/22/2011  HPI: Low back pain  This is 4/4 visits. Pain Assessment Pain Score:   4 Pain Location: Back Pain Onset: More than a month ago  Precautions/Restrictions     Mobility (including Balance)       Exercise/Treatments Cervical Exercises Shoulder Extension: 10 reps;Theraband Row: 10 reps;Theraband Theraband Level (Row): Level 3 (Green) Scapular Retraction: 10 reps;Theraband Theraband Level (Scapular Retraction): Level 3 (Green) Lumbar Stretches Active Hamstring Stretch: 3 reps;30 seconds Single Knee to Chest Stretch: 3 reps;30 seconds Lumbar Exercises Scapular Retraction: 10 reps;Theraband Theraband Level (Scapular Retraction): Level 3 (Green) Row: 10 reps;Theraband Theraband Level (Row): Level 3 (Green) Shoulder Extension: 10 reps;Theraband Stability Exercises Clam: 15 reps Bridge: 15 reps Bent Knee Raise:  (12) Ab Set: 10 reps Leg Raise: Right;Left;10 reps Functional Squats: 15 reps Heel Raises: 15 reps Lumbar Machine Exercises Cybex Lumbar Extension: 2.5 Pl x 10 rep Hip Stretches Active Hamstring Stretch: 3 reps;30 seconds Knee Stretches Active Hamstring Stretch: 3 reps;30 seconds Knee Exercises Heel Raises: 15 reps Ankle Exercises Heel Raises: 15 reps Balance Exercises Heel Raises: 15 reps    Goals PT Short Term Goals Short Term Goal 1 Progress: Progressing toward goal Short Term Goal 2 Progress: Not met Short Term Goal 3 Progress: Not met End of Session Patient Active Problem List  Diagnoses  . KNEE, ARTHRITIS, DEGEN./OSTEO  . ANSERINE BURSITIS, LEFT  . ILIOTIBIAL BAND SYNDROME, LEFT KNEE  . Iliotibial band syndrome of left side  . Back pain  . Backache, unspecified  . Muscle weakness (generalized)  . Knee pain, right  . Knee pain, left  . DDD (degenerative disc disease), lumbar   PT - End of Session Activity Tolerance: Patient tolerated treatment  well General Behavior During Session: [facility] for tasks performed Cognition: [facility] for tasks performed PT Assessment and Plan Rehab Potential: Good PT Frequency: Min 3X/week PT Duration: 6 weeks PT Treatment/Interventions: Therapeutic exercise PT Plan: begin sidelying clam and abduction as well as prone heel squeeze next tx.  RUSSELL,CINDY 02/22/2011, 10:06 AM

## 2011-02-25 ENCOUNTER — Ambulatory Visit (HOSPITAL_COMMUNITY)
Admission: RE | Admit: 2011-02-25 | Discharge: 2011-02-25 | Disposition: A | Discharge status: Home / Self Care | Payer: Medicare HMO | Source: Ambulatory Visit | Attending: Orthopedic Surgery | Admitting: Orthopedic Surgery

## 2011-02-25 NOTE — Progress Notes (Signed)
Physical Therapy Treatment Patient Name: [name] [id number]'U Date: 02/25/2011   Time In: 9:04  Time Out: 9:55 Visit #: 5 out of 12 Next Re-eval: 03/14/2011 Charge: MHP x 30 min Therex x 40 min  Subjective: Symptoms/Limitations Symptoms: 6/10 low back pain Pain Assessment Currently in Pain?: Yes Pain Score:   6 Pain Location: Back Pain Orientation: Lower Pain Type: Neuropathic pain Pain Onset: More than a month ago Pain Frequency: Intermittent  Objective:   Forward rolled shoulders.  Added MHP prior therex secondary to high pain level.    Exercise/Treatments MHP Supine x 10 min before session to reduce pain/ increase flexibility  SUPINE: Active Hamstring st 3x 30" IT Band st: 3x30" Single knee to chest: 3x 30" Clam: 15 reps Bridge: 15 reps Bent knee raise: 15 reps AB set 15x 5" SLR 10 reps  STANDING: Heel raise: 15 reps Functional squats: 15 reps Theraband (green) Shoulder ext Row Retraction   Lumbar Stretches Active Hamstring Stretch: 3 reps;30 seconds Single Knee to Chest Stretch: 3 reps;20 seconds ITB Stretch: 3 reps;30 seconds Stability Exercises Clam: 15 reps;Supine Bridge: 15 reps Bent Knee Raise: 15 reps Ab Set: 15 reps;5 seconds Single Arm Raise: 10 reps Hip Stretches Active Hamstring Stretch: 3 reps;30 seconds Knee Stretches Active Hamstring Stretch: 3 reps;30 seconds ITB Stretch: 3 reps;30 seconds Modalities Modalities: Moist Heat Moist Heat Therapy Number Minutes Moist Heat: 30 Minutes Moist Heat Location:  (back)  Goals   End of Session Patient Active Problem List  Diagnoses  . KNEE, ARTHRITIS, DEGEN./OSTEO  . ANSERINE BURSITIS, LEFT  . ILIOTIBIAL BAND SYNDROME, LEFT KNEE  . Iliotibial band syndrome of left side  . Back pain  . Backache, unspecified  . Muscle weakness (generalized)  . Knee pain, right  . Knee pain, left  . DDD (degenerative disc disease), lumbar   PT Assessment and Plan Clinical Impression Statement:  Pt required vc/tc for proper position and tech with tband, and abd iso.  MHP at beginning of session and with supine therex helped reduce pain.  Performed clam supine this session secondary to high back pain. PT Plan: Assess pain level, begin sidelying clam and abduction and prone heel squeeze next tx.  Juel Burrow 02/25/2011, 9:59 AM

## 2011-02-27 ENCOUNTER — Ambulatory Visit (HOSPITAL_COMMUNITY)
Admission: RE | Admit: 2011-02-27 | Discharge: 2011-02-27 | Disposition: A | Discharge status: Home / Self Care | Payer: Medicare HMO | Source: Ambulatory Visit | Attending: Orthopedic Surgery | Admitting: Orthopedic Surgery

## 2011-02-27 NOTE — Progress Notes (Signed)
Physical Therapy Treatment Patient Name: VALINE DROZDOWSKI HYQMV'H Date: 02/27/2011  Time In: 8:55  Time Out: 9:30 Visit #: 6 out of 12  Next Re-eval: 03/14/2011  Charge: Therex 32'   HPI: Symptoms/Limitations Symptoms: no pain today so far. Pain Assessment Currently in Pain?: No/denies   Exercise/Treatments Cervical Exercises Shoulder Extension: 10 reps;Theraband Theraband Level (Shoulder Extension): Level 3 (Green) Row: 10 reps;Theraband Theraband Level (Row): Level 3 (Green) Scapular Retraction: 10 reps;Theraband Theraband Level (Scapular Retraction): Level 3 (Green) Lumbar Stretches Active Hamstring Stretch: 3 reps;30 seconds Single Knee to Chest Stretch: 3 reps;20 seconds Lumbar Exercises Scapular Retraction: 10 reps;Theraband Theraband Level (Scapular Retraction): Level 3 (Green) Row: 10 reps;Theraband Theraband Level (Row): Level 3 (Green) Shoulder Extension: 10 reps;Theraband Theraband Level (Shoulder Extension): Level 3 (Green) Stability Exercises Clam: 15 reps;Supine;Side-lying;10 reps (supine x15; SL x10 B ) Bridge: 15 reps Bent Knee Raise: 15 reps Heel Squeeze: 10 reps;5 seconds Single Arm Raise: 10 reps Leg Raise: 10 reps;Right;Left;Prone Functional Squats: 20 reps Heel Raises: 20 reps Lumbar Machine Exercises Tread Mill: 3'@1 .0 Hip Stretches Active Hamstring Stretch: 3 reps;30 seconds Additional Hip Exercises Tread Mill: 3'@1 .0 Knee Stretches Active Hamstring Stretch: 3 reps;30 seconds Knee Exercises Heel Raises: 20 reps Ankle Exercises Heel Raises: 20 reps Balance Exercises Tread Mill: 3'@1 .0 Heel Raises: 20 reps (Each exercise done once, repeated exercise due to computer error)   Goals PT Short Term Goals Short Term Goal 1 Progress: Progressing toward goal Short Term Goal 2 Progress: Partly met Short Term Goal 3 Progress: Progressing toward goal PT Long Term Goals Long Term Goal 1 Progress: Progressing toward goal Long Term Goal 2  Progress: Progressing toward goal End of Session Patient Active Problem List  Diagnoses  . KNEE, ARTHRITIS, DEGEN./OSTEO  . ANSERINE BURSITIS, LEFT  . ILIOTIBIAL BAND SYNDROME, LEFT KNEE  . Iliotibial band syndrome of left side  . Back pain  . Backache, unspecified  . Muscle weakness (generalized)  . Knee pain, right  . Knee pain, left  . DDD (degenerative disc disease), lumbar   PT - End of Session Activity Tolerance: Patient tolerated treatment well General Behavior During Session: [facility] for tasks performed Cognition: [facility] for tasks performed PT Assessment and Plan Clinical Impression Statement: Pt requires VC/TC for proper position and technique with scapular theraband ex. Pt displays increased activity tolerance secondary to decreased pain. Pt requires VC/TC for heel- toe pattern and to avoid forward trunk flexion with TM. Began SlL clams , SL hip abduction, and prone heel squeeze w/o difficulty after VC/TC for technique. PT Treatment/Interventions: Therapeutic exercise PT Plan: Continue to progress as pain allows.  Seth Bake [facility] 02/27/2011, 9:37 AM

## 2011-03-01 ENCOUNTER — Ambulatory Visit (HOSPITAL_COMMUNITY): Payer: Medicare HMO | Admitting: Physical Therapy

## 2011-03-04 ENCOUNTER — Ambulatory Visit (HOSPITAL_COMMUNITY)
Admission: RE | Admit: 2011-03-04 | Discharge: 2011-03-04 | Disposition: A | Discharge status: Home / Self Care | Payer: Medicare HMO | Source: Ambulatory Visit | Attending: Orthopedic Surgery | Admitting: Orthopedic Surgery

## 2011-03-04 NOTE — Progress Notes (Signed)
Physical Therapy Treatment Patient Name: [name] Date: 03/04/2011  Time In: 9:07  Time Out: 9:55  Visit #: 7 out of 12  Next Re-eval: 03/14/2011  Charge: MHP x 25 min (10' with supine therex; 15' without therex) Therex x 28 min   HPI: Symptoms/Limitations Symptoms: My backs been hurting all weekend. I did help my daughter in the yard Saturday. Pain Assessment Currently in Pain?: Yes Pain Score:   5 Pain Location: Back Pain Orientation: Lower (R>L)   Exercise/Treatments  Warm up: Tread Mill: 3'@1 .0  Standing: Shoulder Extension: 10 reps;Theraband Theraband Level (Shoulder Extension): Level 3 (Green) Row: 10 reps;Theraband Theraband Level (Row): Level 3 (Green) Scapular Retraction: 10 reps;Theraband Theraband Level (Scapular Retraction): Level 3 (Green) Functional Squats: 20 reps Heel Raises: 20 reps  Supine: Clam: 10 reps;Supine Bridge: 10 reps;Supine Bent Knee Raise: 10 reps;Supine Active Hamstring Stretch: 3 reps;30 seconds Single Knee to Chest Stretch: 3 reps;30 seconds ITB Stretch: 3 reps;30 seconds   Modalities Modalities: Moist Heat Moist Heat Therapy Number Minutes Moist Heat: 25 Minutes (with 10' of supine therex and 15' just in supine) Moist Heat Location: Other (comment) (Back)  Goals PT Short Term Goals Short Term Goal 1: I HEP Short Term Goal 1 Progress: Met Short Term Goal 2: Pain level no greater than a 2/10 to allow patient to sleep without waking Short Term Goal 2 Progress: Partly met Short Term Goal 3: Patient to state that pain has not readiated past upper thigh Short Term Goal 3 Progress: Progressing toward goal PT Long Term Goals Long Term Goal 1: I Advanced HEP Long Term Goal 1 Progress: Progressing toward goal Long Term Goal 2: Patient able to complete four hours of activity without any radicular pain Long Term Goal 2 Progress: Progressing toward goal End of Session Patient Active Problem List  Diagnoses  . KNEE,  ARTHRITIS, DEGEN./OSTEO  . ANSERINE BURSITIS, LEFT  . ILIOTIBIAL BAND SYNDROME, LEFT KNEE  . Iliotibial band syndrome of left side  . Back pain  . Backache, unspecified  . Muscle weakness (generalized)  . Knee pain, right  . Knee pain, left  . DDD (degenerative disc disease), lumbar   PT - End of Session Activity Tolerance: Patient tolerated treatment well General Behavior During Session: [facility] for tasks performed Cognition: [facility] for tasks performed PT Assessment and Plan Clinical Impression Statement: Therex intensity decreased secondary to increased pain. MHP applied with supine therex. Pt reports pain decrease from 5/10(pre tx)  to 1/10(post tx).  PT Treatment/Interventions: Therapeutic exercise;Other (comment) (MHP ) PT Plan: Continue to progress per PT POC. Assess pain next tx.  Seth Bake Leah 03/04/2011, 10:07 AM

## 2011-03-06 ENCOUNTER — Ambulatory Visit (HOSPITAL_COMMUNITY)
Admission: RE | Admit: 2011-03-06 | Discharge: 2011-03-06 | Disposition: A | Discharge status: Home / Self Care | Payer: Medicare HMO | Source: Ambulatory Visit | Attending: Family Medicine | Admitting: Family Medicine

## 2011-03-06 DIAGNOSIS — M25569 Pain in unspecified knee: Secondary | ICD-10-CM | POA: Insufficient documentation

## 2011-03-06 DIAGNOSIS — R262 Difficulty in walking, not elsewhere classified: Secondary | ICD-10-CM | POA: Insufficient documentation

## 2011-03-06 DIAGNOSIS — IMO0001 Reserved for inherently not codable concepts without codable children: Secondary | ICD-10-CM | POA: Insufficient documentation

## 2011-03-06 DIAGNOSIS — M549 Dorsalgia, unspecified: Secondary | ICD-10-CM | POA: Insufficient documentation

## 2011-03-06 DIAGNOSIS — M6281 Muscle weakness (generalized): Secondary | ICD-10-CM | POA: Insufficient documentation

## 2011-03-06 NOTE — Progress Notes (Signed)
Physical Therapy Treatment  Patient Name: [name]  ZOXWR'U Date: 8/01//2012   Time In: 8:41  Time Out: 9:19 Visit #: 8 out of 12  Next Re-eval: 03/14/2011  Charge: MHP x 25 min, Therex x 26 min   SUBJECTIVE:  Pt. Reports decreased back pain today.  3/10 (5/10 last visit)  OBJECTIVE:  Exercise/Treatments  Warm up:  Tread Mill: 5'@1 .3  Standing:  Shoulder Extension: 15 reps;Theraband  Theraband Level (Shoulder Extension): Level 3 (Green)  Row: 15 reps;Theraband  Theraband Level (Row): Level 3 (Green)  Scapular Retraction: 15 reps;Theraband  Theraband Level (Scapular Retraction): Level 3 (Green)  Functional Squats: 20 reps  Heel Raises: 20 reps  Supine:  Clam: 15 reps;Supine  Bridge: 15 reps;Supine  Bent Knee Raise: 15 reps;Supine  Active Hamstring Stretch: 3 reps;30 seconds  Single Knee to Chest Stretch: 3 reps;30 seconds  ITB Stretch: 3 reps;30 seconds  Modalities  Moist Heat Therapy low back 25' supine with therex.   Goals  PT Short Term Goals  Short Term Goal 1: I HEP  Short Term Goal 1 Progress: Met  Short Term Goal 2: Pain level no greater than a 2/10 to allow patient to sleep without waking  Short Term Goal 2 Progress: Partly met  Short Term Goal 3: Patient to state that pain has not readiated past upper thigh  Short Term Goal 3 Progress: Progressing toward goal  PT Long Term Goals  Long Term Goal 1: I Advanced HEP  Long Term Goal 1 Progress: Progressing toward goal  Long Term Goal 2: Patient able to complete four hours of activity without any radicular pain  Long Term Goal 2 Progress: Progressing toward goal   End of Session  Patient Active Problem List   Diagnoses   .  KNEE, ARTHRITIS, DEGEN./OSTEO   .  ANSERINE BURSITIS, LEFT   .  ILIOTIBIAL BAND SYNDROME, LEFT KNEE   .  Iliotibial band syndrome of left side   .  Back pain   .  Backache, unspecified   .  Muscle weakness (generalized)   .  Knee pain, right   .  Knee pain, left   .   DDD  (degenerative disc disease), lumbar   PT - End of Session  Activity Tolerance: Patient tolerated treatment well  General  Behavior During Session: [facility] for tasks performed  Cognition: [facility] for tasks performed  PT Assessment and Plan  Clinical Impression Statement: Overall decreased pain today; requires manual and VC's with theraband exercise and cues to ambulate with increased stride and erect posture.  PT Treatment/Interventions: Therapeutic exercise;Other (comment) (MHP )  PT Plan: Continue to progress per PT POC.  Add corner stretch for chest next visit.   Lurena Nida, PTA  03/06/2011, 10:37 AM

## 2011-03-08 ENCOUNTER — Ambulatory Visit (HOSPITAL_COMMUNITY)
Admission: RE | Admit: 2011-03-08 | Discharge: 2011-03-08 | Disposition: A | Discharge status: Home / Self Care | Payer: Medicare HMO | Source: Ambulatory Visit | Attending: Physical Therapy | Admitting: Physical Therapy

## 2011-03-08 NOTE — Patient Instructions (Signed)
Stretch after strengthening ex.

## 2011-03-08 NOTE — Progress Notes (Signed)
Physical Therapy Treatment Patient Name: [name] [id number]'U Date: 03/08/2011  HPI: Low back pain with radicular symptoms Symptoms/Limitations Symptoms: improved pain Pain Assessment Currently in Pain?: Yes Pain Score:   2 Pain Location: Back Pain Type: Chronic pain  Precautions/Restrictions     Mobility (including Balance)       Exercise/Treatments Lumbar Stretches Active Hamstring Stretch: 3 reps;30 seconds Single Knee to Chest Stretch: 3 reps;30 seconds Stability Exercises Large Ball Abdominal Isometric: 10 reps Large Ball Oblique Isometric: 10 reps Heel Squeeze: 15 reps Single Arm Raise: 15 reps Leg Raise: 15 reps Opposite Arm/Leg Raise: 15 reps Functional Squats: 20 reps Lumbar Machine Exercises Cybex Lumbar Extension: 2.5 plx 15 Tread Mill: 6'@1 .4 Balance Exercises Tread Mill: 6'@1 .4 Completed forward and side lunges x15  @ working on keeping back straight.    Goals PT Short Term Goals Short Term Goal 1 Progress: Met Short Term Goal 2 Progress: Partly met Short Term Goal 3 Progress: Not met PT Long Term Goals Long Term Goal 1 Progress: Met Long Term Goal 2 Progress: Partly met End of Session Patient Active Problem List  Diagnoses  . KNEE, ARTHRITIS, DEGEN./OSTEO  . ANSERINE BURSITIS, LEFT  . ILIOTIBIAL BAND SYNDROME, LEFT KNEE  . Iliotibial band syndrome of left side  . Back pain  . Backache, unspecified  . Muscle weakness (generalized)  . Knee pain, right  . Knee pain, left  . DDD (degenerative disc disease), lumbar   PT - End of Session Activity Tolerance: Patient tolerated treatment well General Behavior During Session: [facility] for tasks performed Cognition: [facility] for tasks performed PT Assessment and Plan Rehab Potential: Good PT Frequency: Min 3X/week PT Duration: 6 weeks PT Plan: Patient to be re-assessed next week  Mionna Advincula,CINDY 03/08/2011, 8:48 AM

## 2011-03-11 ENCOUNTER — Ambulatory Visit (HOSPITAL_COMMUNITY)
Admission: RE | Admit: 2011-03-11 | Discharge: 2011-03-11 | Disposition: A | Discharge status: Home / Self Care | Payer: Medicare HMO | Source: Ambulatory Visit | Attending: Family Medicine | Admitting: Family Medicine

## 2011-03-11 NOTE — Progress Notes (Signed)
Physical Therapy Treatment Patient Name: [name] Date: 03/11/2011  Time In: 8:51  Time Out: 9:19  Visit #: 10 out of 12  Next Re-eval: 03/14/2011  Charge: Therex x 26 min; no heat needed today   HPI: Symptoms/Limitations Symptoms: No pain at all today!! Pain Assessment Currently in Pain?: No/denies Pain Score: 0-No pain Multiple Pain Sites: No  Precautions/Restrictions:  None noted         OBJECTIVE:  Exercise/Treatments  Warm up:  Tread Mill: 6'@1 .   Standing:  Shoulder Extension: 15 reps;Theraband  Theraband Level (Shoulder Extension): Level 3 (Green)  Row: 15 reps;Theraband  Theraband Level (Row): Level 3 (Green)  Scapular Retraction: 15 reps;Theraband  Theraband Level (Scapular Retraction): Level 3 (Green)  Functional Squats: 20 reps  Heel Raises: 20 reps   Seated: Cybex Lumbar Extension: 2.5 plx 15   Prone: Heel Squeeze: 15 reps  Single Arm Raise: 10 reps  Leg Raise: 10 reps  Opposite Arm/Leg Raise: 10 reps   Supine:  Bridge: 15 reps;Supine  Active Hamstring Stretch: 3 reps;30 seconds  Single Knee to Chest Stretch: 3 reps;30 seconds  Large Ball Abdominal Isometric: 10 reps  Large Ball Oblique Isometric: 10 reps      End of Session Patient Active Problem List  Diagnoses  . KNEE, ARTHRITIS, DEGEN./OSTEO  . ANSERINE BURSITIS, LEFT  . ILIOTIBIAL BAND SYNDROME, LEFT KNEE  . Iliotibial band syndrome of left side  . Back pain  . Backache, unspecified  . Muscle weakness (generalized)  . Knee pain, right  . Knee pain, left  . DDD (degenerative disc disease), lumbar   PT - End of Session Activity Tolerance: Patient tolerated treatment well General Behavior During Session: [facility] for tasks performed Cognition: [facility] for tasks performed PT Assessment and Plan Clinical Impression Statement: Pt. able to complete all therex without difficulty.  Heat not used today secondary to being painfree. PT Treatment/Interventions: Therapeutic  exercise PT Plan: Continue X 2 More visits; Re-eval end of this week.  Bascom Levels, Amy B 03/11/2011, 9:28 AM

## 2011-03-11 NOTE — Patient Instructions (Addendum)
Pt. Instructed to continue theraband exercise at home and the remaining of her HEP.

## 2011-03-13 ENCOUNTER — Ambulatory Visit (HOSPITAL_COMMUNITY)
Admission: RE | Admit: 2011-03-13 | Discharge: 2011-03-13 | Disposition: A | Discharge status: Home / Self Care | Payer: Medicare HMO | Source: Ambulatory Visit | Attending: Family Medicine | Admitting: Family Medicine

## 2011-03-13 NOTE — Progress Notes (Signed)
Physical Therapy Treatment Patient Name: [name] Date: 03/13/2011  Time In: 9:41  Time Out: 10:22  Visit #: 11 out of 12  Next Re-eval: 03/14/2011  Charge: Therex x 30 min; no heat needed today    SUBJECTIVE: Symptoms/Limitations Symptoms: Still having no pain; doing well. Pain Assessment Currently in Pain?: No/denies  Precautions/Restrictions:  None noted         Exercise/Treatments Warm up:  Tread Mill: 6'@1 .  Standing:  Shoulder Extension: 15 reps;Theraband  Theraband Level (Shoulder Extension): Level 3 (Green)  Row: 15 reps;Theraband  Theraband Level (Row): Level 3 (Green)  Scapular Retraction: 15 reps;Theraband  Theraband Level (Scapular Retraction): Level 3 (Green)  Functional Squats: 20 reps  Heel Raises: 20 reps  Seated:  Cybex Lumbar Extension: 2.5 plx 15  Prone:  Heel Squeeze: 15 reps  Single Arm Raise: 15 reps  Leg Raise: 15 reps  Opposite Arm/Leg Raise: 15 reps  Supine:  Bridge: 15 reps Supine  Active Hamstring Stretch: 3 reps 30 seconds  Single Knee to Chest Stretch: 3 reps 30 seconds  Large Ball Abdominal Isometric: 15 reps  Large Ball Oblique Isometric: 15 reps     Goals PT Short Term Goals Short Term Goal 1 Progress: Met Short Term Goal 2 Progress: Met Short Term Goal 3 Progress: Partly met PT Long Term Goals Long Term Goal 1 Progress: Met Long Term Goal 2 Progress: Progressing toward goal End of Session Patient Active Problem List  Diagnoses  . KNEE, ARTHRITIS, DEGEN./OSTEO  . ANSERINE BURSITIS, LEFT  . ILIOTIBIAL BAND SYNDROME, LEFT KNEE  . Iliotibial band syndrome of left side  . Back pain  . Backache, unspecified  . Muscle weakness (generalized)  . Knee pain, right  . Knee pain, left  . DDD (degenerative disc disease), lumbar   PT - End of Session Activity Tolerance: Patient tolerated treatment well General Behavior During Session: [facility] for tasks performed Cognition: [facility] for tasks performed PT Assessment and  Plan Clinical Impression Statement: Pt. able to increase reps without difficulty; overall improving with decreased pain. PT Treatment/Interventions: Therapeutic exercise PT Plan: Re-eval next visit.  Bascom Levels, Amy B 03/13/2011, 10:20 AM

## 2011-03-15 ENCOUNTER — Ambulatory Visit (HOSPITAL_COMMUNITY)
Admission: RE | Admit: 2011-03-15 | Discharge: 2011-03-15 | Disposition: A | Discharge status: Home / Self Care | Payer: Medicare HMO | Source: Ambulatory Visit | Attending: Family Medicine | Admitting: Family Medicine

## 2011-03-15 NOTE — Patient Instructions (Signed)
Decompression ex 1-5; T-band decompressive exercises.

## 2011-03-15 NOTE — Progress Notes (Signed)
Physical Therapy Treatment Patient Name: [name] [id number] Date: 03/15/2011  HPI: Radicular back pain  Pt needs to be re-assessed next treatment. Symptoms/Limitations Symptoms: No pain today but she was hurting at an 8 yesterday. How long can you walk comfortably?: walking occasionally causes her to have pain going down right leg to the knee.   Today started having leg pain after five minutes. Pain Assessment Currently in Pain?: Yes Pain Score:   4 Pain Location: Leg Pain Orientation: Right Pain Onset: More than a month ago Pain Frequency: Intermittent  Precautions/Restrictions     Mobility (including Balance)       Exercise/Treatments Lumbar Stretches Passive Hamstring Stretch: 2 reps;60 seconds Single Knee to Chest Stretch: 2 reps;60 seconds Lower Trunk Rotation:  (Pt instructed in decompression exercises 1-5.) Hip Flexor Stretch:  (Completed band decompressive ex all x5) Piriformis Stretch: 2 reps;60 seconds Lumbar Machine Exercises Tread Mill: 7' 1.5     Goals   End of Session Patient Active Problem List  Diagnoses  . KNEE, ARTHRITIS, DEGEN./OSTEO  . ANSERINE BURSITIS, LEFT  . ILIOTIBIAL BAND SYNDROME, LEFT KNEE  . Iliotibial band syndrome of left side  . Back pain  . Backache, unspecified  . Muscle weakness (generalized)  . Knee pain, right  . Knee pain, left  . DDD (degenerative disc disease), lumbar   PT - End of Session Activity Tolerance: Patient tolerated treatment well General Behavior During Session: [facility] for tasks performed PT Assessment and Plan Clinical Impression Statement: Pt has very tight piriformis mm B, tight hamstrings Rehab Potential: Good PT Frequency: Min 3X/week PT Plan: assess how decompression exercises did for pt over weekend ; continue with piriformis stretches.D/C band scapular exercises due to pt having this as HEP  Taleisha Kaczynski,CINDY 03/15/2011, 10:23 AM

## 2011-03-18 ENCOUNTER — Ambulatory Visit (HOSPITAL_COMMUNITY): Payer: Medicare HMO | Admitting: Physical Therapy

## 2011-03-19 ENCOUNTER — Ambulatory Visit (HOSPITAL_COMMUNITY): Payer: Medicare HMO | Admitting: Physical Therapy

## 2011-03-20 ENCOUNTER — Ambulatory Visit (HOSPITAL_COMMUNITY)
Admission: RE | Admit: 2011-03-20 | Discharge: 2011-03-20 | Disposition: A | Discharge status: Home / Self Care | Payer: Medicare HMO | Source: Ambulatory Visit | Attending: *Deleted | Admitting: *Deleted

## 2011-03-20 NOTE — Progress Notes (Signed)
Physical Therapy Treatment Patient Name: [name] [id number] Date: 03/20/2011  Time In: 9:40 Time Out: 10:18  Visit #: 13  Next Re-eval: next tx Charge: Therex x 30    HPI: Symptoms/Limitations Symptoms: No pain today. Pain Assessment Currently in Pain?: No/denies   Exercise/Treatments  Warm up: Tread Mill: 5' @21 .2  Standing: Functional Squats: 20 reps Wall Slides: 10 reps;4 seconds Heel Raises: 20 reps Toe Raise: 20 reps  Seated: Cybex Lumbar Extension: 3pl x 20  Supine: Passive Hamstring Stretch: 2 reps;60 seconds Single Knee to Chest Stretch: 2 reps;60 seconds Piriformis Stretch: 2 reps;60 seconds  Goals PT Short Term Goals Short Term Goal 1: I HEP Short Term Goal 1 Progress: Met Short Term Goal 2: Pain level no greater than a 2/10 to allow patient to sleep without waking Short Term Goal 2 Progress: Met Short Term Goal 3: Patient to state that pain has not readiated past upper thigh Short Term Goal 3 Progress: Partly met PT Long Term Goals Long Term Goal 1: I Advanced HEP Long Term Goal 1 Progress: Met Long Term Goal 2: Patient able to complete four hours of activity without any radicular pain Long Term Goal 2 Progress: Progressing toward goal End of Session Patient Active Problem List  Diagnoses  . KNEE, ARTHRITIS, DEGEN./OSTEO  . ANSERINE BURSITIS, LEFT  . ILIOTIBIAL BAND SYNDROME, LEFT KNEE  . Iliotibial band syndrome of left side  . Back pain  . Backache, unspecified  . Muscle weakness (generalized)  . Knee pain, right  . Knee pain, left  . DDD (degenerative disc disease), lumbar   PT - End of Session Activity Tolerance: Patient tolerated treatment well General Behavior During Session: [facility] for tasks performed Cognition: [facility] for tasks performed PT Assessment and Plan Clinical Impression Statement: Pt completes therex without difficulty. Pt continues to present with tight piriformis mm bilateraly. PT Treatment/Interventions: Therapeutic  exercise PT Plan:  Reassess next tx.  Seth Bake [facility] 03/20/2011, 11:26 AM

## 2011-03-22 ENCOUNTER — Ambulatory Visit (HOSPITAL_COMMUNITY)
Admission: RE | Admit: 2011-03-22 | Discharge: 2011-03-22 | Disposition: A | Discharge status: Home / Self Care | Payer: Medicare HMO | Source: Ambulatory Visit | Attending: *Deleted | Admitting: *Deleted

## 2011-03-22 NOTE — Progress Notes (Signed)
Physical Therapy D/C Summary  Patient Details  Name: [name] MRN: [id number] Date of Birth: 02-01-1930  Today's Date: 03/22/2011 Time: 0935- 1017   Visit#: 14 of 14 Charges: 8' Gait, 28' TE, 1 MMT D/C today  Past Medical History: No past medical history on file. Past Surgical History:  Past Surgical History  Procedure Date  . Total abdominal hysterectomy w/ bilateral salpingoophorectomy   . Pacemaker surgery   . Polyps in throat     Subjective Symptoms/Limitations Symptoms: Pt reports that activities are getting easier overall.  She states that both of her legs continue to be painful.  She reports that they ache for awhile and then the pain goes away. How long can you sit comfortably?: Couple of hours.   How long can you stand comfortably?: 30 minutes How long can you walk comfortably?: She reports that she can walk the entire mall without an increase in pain.  Walking 30 minutes.   Pain Assessment Currently in Pain?: No/denies   Assessment LUE AROM (degrees) Overall AROM Left Upper Extremity: Within functional limits for tasks assessed RLE Strength Right Hip Flexion: 3+/5 Right Hip Extension: 4/5 Right Hip ABduction: 5/5 Right Hip ADduction: 5/5 Right Knee Flexion: 5/5 Right Knee Extension: 5/5 Right Ankle Dorsiflexion: 5/5 Right Ankle Plantar Flexion: 4/5 LLE Assessment LLE Assessment: Within Functional Limits Lumbar AROM Lumbar Flexion: WNL Lumbar Extension: Decreased by 60% Lumbar - Right Side Bend: WNL Lumbar - Left Side Bend: decreased 10% Lumbar - Right Rotation: WNL Lumbar - Left Rotation: WNL  Mobility (including Balance)       Exercise/Treatments Stretches Passive Hamstring Stretch: 2 reps;60 seconds Single Knee to Chest Stretch: 2 reps;60 seconds;Limitations Single Knee to Chest Stretch Limitations: BLE Lumbar Exercises Scapular Retraction: Strengthening;Standing;Theraband;Limitations Theraband Level (Scapular Retraction): Level 3  (Green) Scapular Retraction Limitations: 20 reps Row: Strengthening;Standing;Theraband;Limitations Theraband Level (Row): Level 3 (Green) Row Limitations: 20 reps Shoulder Extension: Strengthening;20 reps;Theraband Theraband Level (Shoulder Extension): Level 3 (Green) Stability Functional Squats: 20 reps Heel Raises: 20 reps Wall Slides: 10 reps;4 seconds Machine Exercises Cybex Lumbar Extension: 3pl x 20 Tread Mill: 8' @1 .2  Ankle Exercises Toe Raise: 20 reps  Physical Therapy Assessment and Plan   Goals PT Short Term Goals PT Short Term Goal 1: I HEP PT Short Term Goal 1 - Progress: Met PT Short Term Goal 2: Pain level no greater than a 2/10 to allow patient to sleep without waking PT Short Term Goal 2 - Progress: Met PT Short Term Goal 3: Patient to state that pain has not readiated past upper thigh PT Short Term Goal 3 - Progress: Partly met PT Long Term Goals PT Long Term Goal 1: I Advanced HEP PT Long Term Goal 1 - Progress: Met PT Long Term Goal 2: Patient able to complete four hours of activity without any radicular pain PT Long Term Goal 2 - Progress: Partly met  Problem List Patient Active Problem List  Diagnoses  . KNEE, ARTHRITIS, DEGEN./OSTEO  . ANSERINE BURSITIS, LEFT  . ILIOTIBIAL BAND SYNDROME, LEFT KNEE  . Iliotibial band syndrome of left side  . Back pain  . Backache, unspecified  . Muscle weakness (generalized)  . Knee pain, right  . Knee pain, left  . DDD (degenerative disc disease), lumbar        Chinelo Benn 03/22/2011, 10:14 AM

## 2011-04-23 ENCOUNTER — Ambulatory Visit: Payer: Medicare HMO | Admitting: Orthopedic Surgery

## 2011-05-16 ENCOUNTER — Encounter: Payer: Self-pay | Admitting: Orthopedic Surgery

## 2011-05-16 ENCOUNTER — Ambulatory Visit (INDEPENDENT_AMBULATORY_CARE_PROVIDER_SITE_OTHER): Payer: Medicare HMO | Admitting: Orthopedic Surgery

## 2011-05-16 VITALS — BP 142/60 | Ht 63.0 in | Wt 143.0 lb

## 2011-05-16 DIAGNOSIS — M1710 Unilateral primary osteoarthritis, unspecified knee: Secondary | ICD-10-CM

## 2011-05-16 MED ORDER — METHYLPREDNISOLONE ACETATE 40 MG/ML IJ SUSP: 40.0000 mg | Freq: Once | INTRAMUSCULAR | Status: DC

## 2011-05-16 NOTE — Progress Notes (Signed)
   Followup.  LEFT knee pain.  History of iliotibial band syndrome, knee pain. Previous treatment with physical therapy.  No catching, locking, or giving way  Exam medial and lateral joint line tenderness with joint effusion. Ambulation without cane. The stability normal. Strength normal. Skin over the knee joint. Normal. Normal pulses, and temperature in the LEFT leg.  Injection joint.  Followup January of 2013  Knee  Injection Procedure Note  Pre-operative Diagnosis: left knee oa  Post-operative Diagnosis: same  Indications: pain  Anesthesia: ethyl chloride   Procedure Details   Verbal consent was obtained for the procedure. Time out was completed.The joint was prepped with alcohol, followed by  Ethyl chloride spray and A 20 gauge needle was inserted into the knee via lateral approach; 4ml 1% lidocaine and 1 ml of depomedrol  was then injected into the joint . The needle was removed and the area cleansed and dressed.  Complications:  None; patient tolerated the procedure well.

## 2011-05-16 NOTE — Patient Instructions (Signed)
You have received a steroid shot. 15% of patients experience increased pain at the injection site with in the next 24 hours. This is best treated with ice and tylenol extra strength 2 tabs every 8 hours. If you are still having pain please call the office.    

## 2011-05-17 LAB — DIFFERENTIAL
Basophils Absolute: 0
Basophils Relative: 1
Eosinophils Absolute: 0.3
Eosinophils Relative: 1
Lymphocytes Relative: 9 — ABNORMAL LOW
Monocytes Absolute: 0.5
Monocytes Relative: 7
Neutro Abs: 8.9 — ABNORMAL HIGH
Neutrophils Relative %: 62
Neutrophils Relative %: 84 — ABNORMAL HIGH

## 2011-05-17 LAB — COMPREHENSIVE METABOLIC PANEL
BUN: 11
CO2: 29
Chloride: 106
Creatinine, Ser: 0.65
GFR calc non Af Amer: >60
Glucose, Bld: 97
Total Bilirubin: 0.6

## 2011-05-17 LAB — BASIC METABOLIC PANEL
CO2: 27
Chloride: 111
Creatinine, Ser: 0.55
GFR calc Af Amer: >60

## 2011-05-17 LAB — PROTIME-INR
INR: 0.9
Prothrombin Time: 12.6

## 2011-05-17 LAB — CBC
HCT: 37.8
Hemoglobin: 10.9 — ABNORMAL LOW
MCHC: 34.2
MCHC: 34.3
MCV: 93.1
MCV: 93.3
RBC: 3.43 — ABNORMAL LOW
RBC: 4.05
WBC: 10.6 — ABNORMAL HIGH

## 2011-05-17 LAB — POCT CARDIAC MARKERS: Operator id: 240821

## 2011-05-17 LAB — URINALYSIS, ROUTINE W REFLEX MICROSCOPIC
Glucose, UA: NEGATIVE
Hgb urine dipstick: NEGATIVE
Protein, ur: NEGATIVE

## 2011-05-17 LAB — D-DIMER, QUANTITATIVE: D-Dimer, Quant: 1.19 — ABNORMAL HIGH

## 2011-05-17 LAB — URINE MICROSCOPIC-ADD ON

## 2011-05-31 ENCOUNTER — Other Ambulatory Visit (HOSPITAL_COMMUNITY): Payer: Self-pay | Admitting: Family Medicine

## 2011-05-31 DIAGNOSIS — Z139 Encounter for screening, unspecified: Secondary | ICD-10-CM

## 2011-06-03 ENCOUNTER — Other Ambulatory Visit (HOSPITAL_COMMUNITY): Payer: Medicare HMO

## 2011-06-05 ENCOUNTER — Ambulatory Visit (HOSPITAL_COMMUNITY)
Admission: RE | Admit: 2011-06-05 | Discharge: 2011-06-05 | Disposition: A | Discharge status: Home / Self Care | Payer: Medicare HMO | Source: Ambulatory Visit | Attending: Family Medicine | Admitting: Family Medicine

## 2011-06-05 DIAGNOSIS — Z139 Encounter for screening, unspecified: Secondary | ICD-10-CM

## 2011-06-05 DIAGNOSIS — Z13820 Encounter for screening for osteoporosis: Secondary | ICD-10-CM | POA: Insufficient documentation

## 2011-06-05 DIAGNOSIS — Z780 Asymptomatic menopausal state: Secondary | ICD-10-CM | POA: Insufficient documentation

## 2011-06-05 DIAGNOSIS — M899 Disorder of bone, unspecified: Secondary | ICD-10-CM | POA: Insufficient documentation

## 2011-08-20 ENCOUNTER — Ambulatory Visit: Payer: Medicare HMO | Admitting: Orthopedic Surgery

## 2011-08-20 ENCOUNTER — Ambulatory Visit (INDEPENDENT_AMBULATORY_CARE_PROVIDER_SITE_OTHER): Payer: Medicare Other | Admitting: Orthopedic Surgery

## 2011-08-20 ENCOUNTER — Encounter: Payer: Self-pay | Admitting: Orthopedic Surgery

## 2011-08-20 VITALS — Ht 63.0 in | Wt 143.0 lb

## 2011-08-20 DIAGNOSIS — M629 Disorder of muscle, unspecified: Secondary | ICD-10-CM

## 2011-08-20 DIAGNOSIS — M7632 Iliotibial band syndrome, left leg: Secondary | ICD-10-CM

## 2011-08-20 NOTE — Patient Instructions (Signed)
You have received a steroid shot. 15% of patients experience increased pain at the injection site with in the next 24 hours. This is best treated with ice and tylenol extra strength 2 tabs every 8 hours. If you are still having pain please call the office.    

## 2011-08-28 NOTE — Progress Notes (Signed)
Patient ID: [name], female   DOB: Feb 11, 1930, 78 y.o.   MRN: [id number]  Chief complaint lateral leg pain  Patient is a pain along the lateral aspect of her LEFT knee associated with the iliotibial band.  She is to see previous injection of cortisone with good result.  She also had physical therapy after her knee injury.  She is still having difficulty with activities of daily living and is not in shape to work at this time   Review of systems negative for catching locking giving way  No effusion is noted in the knee.  She has tenderness over the lateral joint line and iliotibial band which is exacerbated by extension of the knee.  There no meniscal signs.  Knee is stable.  Neurovascular exam is normal.  Under sterile conditions the iliotibial band area was injected with Depo-Medrol 40 mg and lidocaine 1% to ML's.  This was well tolerated without complication prior to procedure verbal consent and timeout were completed  Followup 3 months

## 2011-08-29 ENCOUNTER — Encounter: Payer: Self-pay | Admitting: Orthopedic Surgery

## 2011-08-30 ENCOUNTER — Ambulatory Visit (HOSPITAL_COMMUNITY)
Admission: RE | Admit: 2011-08-30 | Discharge: 2011-08-30 | Disposition: A | Discharge status: Home / Self Care | Payer: Medicare Other | Source: Ambulatory Visit | Attending: Family Medicine | Admitting: Family Medicine

## 2011-08-30 ENCOUNTER — Other Ambulatory Visit (HOSPITAL_COMMUNITY): Payer: Self-pay | Admitting: Family Medicine

## 2011-08-30 DIAGNOSIS — J449 Chronic obstructive pulmonary disease, unspecified: Secondary | ICD-10-CM

## 2011-08-30 DIAGNOSIS — R1011 Right upper quadrant pain: Secondary | ICD-10-CM | POA: Insufficient documentation

## 2011-08-30 DIAGNOSIS — R079 Chest pain, unspecified: Secondary | ICD-10-CM

## 2011-08-30 DIAGNOSIS — J4489 Other specified chronic obstructive pulmonary disease: Secondary | ICD-10-CM | POA: Insufficient documentation

## 2011-09-04 ENCOUNTER — Ambulatory Visit (HOSPITAL_COMMUNITY)
Admission: RE | Admit: 2011-09-04 | Discharge: 2011-09-04 | Disposition: A | Discharge status: Home / Self Care | Payer: Medicare Other | Source: Ambulatory Visit | Attending: Family Medicine | Admitting: Family Medicine

## 2011-09-04 DIAGNOSIS — R079 Chest pain, unspecified: Secondary | ICD-10-CM

## 2011-09-04 DIAGNOSIS — K8020 Calculus of gallbladder without cholecystitis without obstruction: Secondary | ICD-10-CM | POA: Insufficient documentation

## 2011-09-04 DIAGNOSIS — R1011 Right upper quadrant pain: Secondary | ICD-10-CM | POA: Insufficient documentation

## 2011-09-04 DIAGNOSIS — K838 Other specified diseases of biliary tract: Secondary | ICD-10-CM | POA: Insufficient documentation

## 2011-09-19 ENCOUNTER — Encounter (HOSPITAL_COMMUNITY)
Admission: RE | Admit: 2011-09-19 | Discharge: 2011-09-19 | Disposition: A | Discharge status: Home / Self Care | Payer: Medicare Other | Source: Ambulatory Visit | Attending: General Surgery | Admitting: General Surgery

## 2011-09-19 ENCOUNTER — Encounter (HOSPITAL_COMMUNITY): Payer: Self-pay

## 2011-09-19 HISTORY — DX: Pure hypercholesterolemia, unspecified: E78.00

## 2011-09-19 HISTORY — DX: Gastro-esophageal reflux disease without esophagitis: K21.9

## 2011-09-19 HISTORY — DX: Essential (primary) hypertension: I10

## 2011-09-19 HISTORY — DX: Shortness of breath: R06.02

## 2011-09-19 HISTORY — DX: Cardiac arrhythmia, unspecified: I49.9

## 2011-09-19 HISTORY — DX: Heart failure, unspecified: I50.9

## 2011-09-19 HISTORY — DX: Unspecified malignant neoplasm of skin of unspecified upper limb, including shoulder: C44.601

## 2011-09-19 HISTORY — DX: Chronic obstructive pulmonary disease, unspecified: J44.9

## 2011-09-19 HISTORY — DX: Pain in left knee: M25.562

## 2011-09-19 HISTORY — DX: Peripheral vascular disease, unspecified: I73.9

## 2011-09-19 LAB — COMPREHENSIVE METABOLIC PANEL
ALT: 16 U/L (ref 0–35)
AST: 21 U/L (ref 0–37)
Calcium: 9.8 mg/dL (ref 8.4–10.5)
GFR calc Af Amer: 68 mL/min — ABNORMAL LOW (ref >90)
Sodium: 141 mEq/L (ref 135–145)
Total Protein: 6.7 g/dL (ref 6.0–8.3)

## 2011-09-19 LAB — DIFFERENTIAL
Basophils Absolute: 0.1 10*3/uL (ref 0.0–0.1)
Basophils Relative: 1 % (ref 0–1)
Eosinophils Absolute: 0.2 10*3/uL (ref 0.0–0.7)
Monocytes Relative: 7 % (ref 3–12)
Neutro Abs: 5.1 10*3/uL (ref 1.7–7.7)
Neutrophils Relative %: 65 % (ref 43–77)

## 2011-09-19 LAB — CBC
MCH: 30.7 pg (ref 26.0–34.0)
MCHC: 32.2 g/dL (ref 30.0–36.0)
Platelets: 252 10*3/uL (ref 150–400)
RDW: 14 % (ref 11.5–15.5)

## 2011-09-19 LAB — SURGICAL PCR SCREEN: Staphylococcus aureus: NEGATIVE

## 2011-09-19 NOTE — Patient Instructions (Signed)
20 [name]  09/19/2011   Your procedure is scheduled on: 09/20/11  Report to [facility] at 0920 AM.  Call this number if you have problems the morning of surgery: [phone]   Remember:   Do not eat food:After Midnight.  May have clear liquids:until Midnight .  Clear liquids include soda, tea, black coffee, apple or grape juice, broth.  Take these medicines the morning of surgery with A SIP OF WATER: benzapril,omeprazole,alprazolam, proair   Do not wear jewelry, make-up or nail polish.  Do not wear lotions, powders, or perfumes. You may wear deodorant.  Do not shave 48 hours prior to surgery.  Do not bring valuables to the hospital.  Contacts, dentures or bridgework may not be worn into surgery.  Leave suitcase in the car. After surgery it may be brought to your room.  For patients admitted to the hospital, checkout time is 11:00 AM the day of discharge.   Patients discharged the day of surgery will not be allowed to drive home.  Name and phone number of your driver: family  Special Instructions: CHG Shower Use Special Wash: 1/2 bottle night before surgery and 1/2 bottle morning of surgery.   Please read over the following fact sheets that you were given: Pain Booklet, MRSA Information, Surgical Site Infection Prevention, Anesthesia Post-op Instructions and Care and Recovery After Surgery   PATIENT INSTRUCTIONS POST-ANESTHESIA  IMMEDIATELY FOLLOWING SURGERY:  Do not drive or operate machinery for the first twenty four hours after surgery.  Do not make any important decisions for twenty four hours after surgery or while taking narcotic pain medications or sedatives.  If you develop intractable nausea and vomiting or a severe headache please notify your doctor immediately.  FOLLOW-UP:  Please make an appointment with your surgeon as instructed. You do not need to follow up with anesthesia unless specifically instructed to do so.  WOUND CARE INSTRUCTIONS (if applicable):  Keep a  dry clean dressing on the anesthesia/puncture wound site if there is drainage.  Once the wound has quit draining you may leave it open to air.  Generally you should leave the bandage intact for twenty four hours unless there is drainage.  If the epidural site drains for more than 36-48 hours please call the anesthesia department.  QUESTIONS?:  Please feel free to call your physician or the hospital operator if you have any questions, and they will be happy to assist you.     [facility] Anesthesia Department [address] [place] [phone]    Laparoscopic Cholecystectomy Laparoscopic cholecystectomy is surgery to remove the gallbladder. The gallbladder is located slightly to the right of center in the abdomen, behind the liver. It is a concentrating and storage sac for the bile produced in the liver. Bile aids in the digestion and absorption of fats. Gallbladder disease (cholecystitis) is an inflammation of your gallbladder. This condition is usually caused by a buildup of gallstones (cholelithiasis) in your gallbladder. Gallstones can block the flow of bile, resulting in inflammation and pain. In severe cases, emergency surgery may be required. When emergency surgery is not required, you will have time to prepare for the procedure. Laparoscopic surgery is an alternative to open surgery. Laparoscopic surgery usually has a shorter recovery time. Your common bile duct may also need to be examined and explored. Your caregiver will discuss this with you if he or she feels this should be done. If stones are found in the common bile duct, they may be removed.  LET YOUR CAREGIVER KNOW ABOUT:  Allergies to food or medicine.   Medicines taken, including vitamins, herbs, eyedrops, over-the-counter medicines, and creams.   Use of steroids (by mouth or creams).   Previous problems with anesthetics or numbing medicines.   History of bleeding problems or blood clots.   Previous surgery.    Other health problems, including diabetes and kidney problems.   Possibility of pregnancy, if this applies.  RISKS AND COMPLICATIONS All surgery is associated with risks. Some problems that may occur following this procedure include:  Infection.   Damage to the common bile duct, nerves, arteries, veins, or other internal organs such as the stomach or intestines.   Bleeding.   A stone may remain in the common bile duct.  BEFORE THE PROCEDURE  Do not take aspirin for 3 days prior to surgery or blood thinners for 1 week prior to surgery.   Do not eat or drink anything after midnight the night before surgery.   Let your caregiver know if you develop a cold or other infectious problem prior to surgery.   You should be present 60 minutes before the procedure or as directed.  PROCEDURE  You will be given medicine that makes you sleep (general anesthetic). When you are asleep, your surgeon will make several small cuts (incisions) in your abdomen. One of these incisions is used to insert a small, lighted scope (laparoscope) into the abdomen. The laparoscope helps the surgeon see into your abdomen. Carbon dioxide gas will be pumped into your abdomen. The gas allows more room for the surgeon to perform your surgery. Other operating instruments are inserted through the other incisions. Laparoscopic procedures may not be appropriate when:  There is major scarring from previous surgery.   The gallbladder is extremely inflamed.   There are bleeding disorders or unexpected cirrhosis of the liver.   A pregnancy is near term.   Other conditions make the laparoscopic procedure impossible.  If your surgeon feels it is not safe to continue with a laparoscopic procedure, he or she will perform an open abdominal procedure. In this case, the surgeon will make an incision to open the abdomen. This gives the surgeon a larger view and field to work within. This may allow the surgeon to perform procedures  that sometimes cannot be performed with a laparoscope alone. Open surgery has a longer recovery time. AFTER THE PROCEDURE  You will be taken to the recovery area where a nurse will watch and check your progress.   You may be allowed to go home the same day.   Do not resume physical activities until directed by your caregiver.   You may resume a normal diet and activities as directed.  Document Released: 07/22/2005 Document Revised: 04/03/2011 Document Reviewed: 01/04/2011 [facility] Patient Information 2012 [place], [place].

## 2011-09-20 ENCOUNTER — Ambulatory Visit (HOSPITAL_COMMUNITY)
Admission: RE | Admit: 2011-09-20 | Discharge: 2011-09-22 | Disposition: A | Discharge status: Home / Self Care | Payer: Medicare Other | Source: Ambulatory Visit | Attending: General Surgery | Admitting: General Surgery

## 2011-09-20 ENCOUNTER — Encounter (HOSPITAL_COMMUNITY)
Admission: RE | Disposition: A | Discharge status: Home / Self Care | Payer: Self-pay | Source: Ambulatory Visit | Attending: General Surgery

## 2011-09-20 ENCOUNTER — Other Ambulatory Visit: Payer: Self-pay | Admitting: General Surgery

## 2011-09-20 ENCOUNTER — Ambulatory Visit (HOSPITAL_COMMUNITY): Payer: Medicare Other | Admitting: Anesthesiology

## 2011-09-20 ENCOUNTER — Encounter (HOSPITAL_COMMUNITY): Payer: Self-pay | Admitting: *Deleted

## 2011-09-20 ENCOUNTER — Encounter (HOSPITAL_COMMUNITY): Payer: Self-pay | Admitting: Anesthesiology

## 2011-09-20 DIAGNOSIS — K8010 Calculus of gallbladder with chronic cholecystitis without obstruction: Secondary | ICD-10-CM | POA: Insufficient documentation

## 2011-09-20 DIAGNOSIS — E7800 Pure hypercholesterolemia, unspecified: Secondary | ICD-10-CM | POA: Insufficient documentation

## 2011-09-20 DIAGNOSIS — I509 Heart failure, unspecified: Secondary | ICD-10-CM | POA: Insufficient documentation

## 2011-09-20 DIAGNOSIS — I10 Essential (primary) hypertension: Secondary | ICD-10-CM | POA: Insufficient documentation

## 2011-09-20 DIAGNOSIS — K828 Other specified diseases of gallbladder: Secondary | ICD-10-CM

## 2011-09-20 DIAGNOSIS — K219 Gastro-esophageal reflux disease without esophagitis: Secondary | ICD-10-CM | POA: Insufficient documentation

## 2011-09-20 DIAGNOSIS — J449 Chronic obstructive pulmonary disease, unspecified: Secondary | ICD-10-CM | POA: Insufficient documentation

## 2011-09-20 DIAGNOSIS — IMO0002 Reserved for concepts with insufficient information to code with codable children: Secondary | ICD-10-CM | POA: Insufficient documentation

## 2011-09-20 DIAGNOSIS — E669 Obesity, unspecified: Secondary | ICD-10-CM | POA: Insufficient documentation

## 2011-09-20 DIAGNOSIS — Z950 Presence of cardiac pacemaker: Secondary | ICD-10-CM | POA: Insufficient documentation

## 2011-09-20 DIAGNOSIS — Y9210 Unspecified residential institution as the place of occurrence of the external cause: Secondary | ICD-10-CM | POA: Insufficient documentation

## 2011-09-20 DIAGNOSIS — J4489 Other specified chronic obstructive pulmonary disease: Secondary | ICD-10-CM | POA: Insufficient documentation

## 2011-09-20 DIAGNOSIS — Y836 Removal of other organ (partial) (total) as the cause of abnormal reaction of the patient, or of later complication, without mention of misadventure at the time of the procedure: Secondary | ICD-10-CM | POA: Insufficient documentation

## 2011-09-20 HISTORY — PX: CHOLECYSTECTOMY: SHX55

## 2011-09-20 SURGERY — LAPAROSCOPIC CHOLECYSTECTOMY
Anesthesia: General | Site: Abdomen | Wound class: Clean Contaminated

## 2011-09-20 MED ORDER — FUROSEMIDE 40 MG PO TABS
40.0000 mg | ORAL_TABLET | Freq: Every day | ORAL | Status: DC
  Administered 2011-09-20 – 2011-09-22 (×3): 40 mg via ORAL
  Filled 2011-09-20 (×3): qty 1

## 2011-09-20 MED ORDER — ROCURONIUM BROMIDE 50 MG/5ML IV SOLN
INTRAVENOUS | Status: AC
  Filled 2011-09-20: qty 1

## 2011-09-20 MED ORDER — FENTANYL CITRATE 0.05 MG/ML IJ SOLN
25.0000 ug | INTRAMUSCULAR | Status: DC | PRN
  Administered 2011-09-20 (×2): 50 ug via INTRAVENOUS

## 2011-09-20 MED ORDER — NEOSTIGMINE METHYLSULFATE 1 MG/ML IJ SOLN
INTRAMUSCULAR | Status: DC | PRN
  Administered 2011-09-20: 3 mg via INTRAVENOUS

## 2011-09-20 MED ORDER — LIDOCAINE HCL (PF) 1 % IJ SOLN
INTRAMUSCULAR | Status: AC
  Filled 2011-09-20: qty 5

## 2011-09-20 MED ORDER — LOSARTAN POTASSIUM 50 MG PO TABS
100.0000 mg | ORAL_TABLET | Freq: Every day | ORAL | Status: DC
  Administered 2011-09-20 – 2011-09-22 (×3): 100 mg via ORAL
  Filled 2011-09-20 (×3): qty 2

## 2011-09-20 MED ORDER — ONDANSETRON HCL 4 MG/2ML IJ SOLN
INTRAMUSCULAR | Status: AC
  Filled 2011-09-20: qty 2

## 2011-09-20 MED ORDER — ENOXAPARIN SODIUM 40 MG/0.4ML ~~LOC~~ SOLN
40.0000 mg | SUBCUTANEOUS | Status: DC
  Administered 2011-09-20: 40 mg via SUBCUTANEOUS
  Filled 2011-09-20 (×2): qty 0.4

## 2011-09-20 MED ORDER — FENTANYL CITRATE 0.05 MG/ML IJ SOLN
INTRAMUSCULAR | Status: AC
  Administered 2011-09-20: 50 ug via INTRAVENOUS
  Filled 2011-09-20: qty 2

## 2011-09-20 MED ORDER — HYDROCODONE-ACETAMINOPHEN 5-325 MG PO TABS: 1.0000 | ORAL_TABLET | ORAL | Status: DC | PRN

## 2011-09-20 MED ORDER — PROPOFOL 10 MG/ML IV EMUL
INTRAVENOUS | Status: AC
  Filled 2011-09-20: qty 20

## 2011-09-20 MED ORDER — MIDAZOLAM HCL 2 MG/2ML IJ SOLN
1.0000 mg | INTRAMUSCULAR | Status: DC | PRN
  Administered 2011-09-20: 2 mg via INTRAVENOUS

## 2011-09-20 MED ORDER — ALBUTEROL SULFATE HFA 108 (90 BASE) MCG/ACT IN AERS: 1.0000 | INHALATION_SPRAY | RESPIRATORY_TRACT | Status: DC | PRN

## 2011-09-20 MED ORDER — CIPROFLOXACIN IN D5W 400 MG/200ML IV SOLN
400.0000 mg | INTRAVENOUS | Status: DC
  Filled 2011-09-20: qty 200

## 2011-09-20 MED ORDER — ENOXAPARIN SODIUM 40 MG/0.4ML ~~LOC~~ SOLN
SUBCUTANEOUS | Status: AC
  Filled 2011-09-20: qty 0.4

## 2011-09-20 MED ORDER — FENTANYL CITRATE 0.05 MG/ML IJ SOLN
INTRAMUSCULAR | Status: AC
  Filled 2011-09-20: qty 5

## 2011-09-20 MED ORDER — PANTOPRAZOLE SODIUM 40 MG PO TBEC
40.0000 mg | DELAYED_RELEASE_TABLET | Freq: Every day | ORAL | Status: DC
  Administered 2011-09-20 – 2011-09-22 (×3): 40 mg via ORAL
  Filled 2011-09-20 (×3): qty 1

## 2011-09-20 MED ORDER — ACETAMINOPHEN 325 MG PO TABS: 325.0000 mg | ORAL_TABLET | ORAL | Status: DC | PRN

## 2011-09-20 MED ORDER — ETOMIDATE 2 MG/ML IV SOLN
INTRAVENOUS | Status: DC | PRN
  Administered 2011-09-20: 12 mg via INTRAVENOUS
  Administered 2011-09-20: 4 mg via INTRAVENOUS

## 2011-09-20 MED ORDER — HYDRALAZINE HCL 20 MG/ML IJ SOLN
INTRAMUSCULAR | Status: DC | PRN
  Administered 2011-09-20: 5 mg via INTRAVENOUS

## 2011-09-20 MED ORDER — LACTATED RINGERS IV SOLN
INTRAVENOUS | Status: DC | PRN
  Administered 2011-09-20: 12:00:00 via INTRAVENOUS

## 2011-09-20 MED ORDER — GLYCOPYRROLATE 0.2 MG/ML IJ SOLN: 0.2000 mg | Freq: Once | INTRAMUSCULAR | Status: DC

## 2011-09-20 MED ORDER — CLOPIDOGREL BISULFATE 75 MG PO TABS
75.0000 mg | ORAL_TABLET | Freq: Every day | ORAL | Status: DC
  Administered 2011-09-20 – 2011-09-22 (×3): 75 mg via ORAL
  Filled 2011-09-20 (×3): qty 1

## 2011-09-20 MED ORDER — FENTANYL CITRATE 0.05 MG/ML IJ SOLN
INTRAMUSCULAR | Status: DC | PRN
  Administered 2011-09-20 (×4): 50 ug via INTRAVENOUS

## 2011-09-20 MED ORDER — SODIUM CHLORIDE 0.9 % IR SOLN
Status: DC | PRN
  Administered 2011-09-20: 1000 mL

## 2011-09-20 MED ORDER — ROCURONIUM BROMIDE 100 MG/10ML IV SOLN
INTRAVENOUS | Status: DC | PRN
  Administered 2011-09-20: 5 mg via INTRAVENOUS
  Administered 2011-09-20: 25 mg via INTRAVENOUS

## 2011-09-20 MED ORDER — CIPROFLOXACIN IN D5W 400 MG/200ML IV SOLN
INTRAVENOUS | Status: DC | PRN
  Administered 2011-09-20: 400 mg via INTRAVENOUS

## 2011-09-20 MED ORDER — ENOXAPARIN SODIUM 40 MG/0.4ML ~~LOC~~ SOLN
40.0000 mg | Freq: Once | SUBCUTANEOUS | Status: AC
  Administered 2011-09-20: 40 mg via SUBCUTANEOUS

## 2011-09-20 MED ORDER — LACTATED RINGERS IV SOLN
INTRAVENOUS | Status: DC
  Administered 2011-09-20: 12:00:00 via INTRAVENOUS

## 2011-09-20 MED ORDER — SIMVASTATIN 20 MG PO TABS
20.0000 mg | ORAL_TABLET | Freq: Every day | ORAL | Status: DC
  Administered 2011-09-20 – 2011-09-22 (×3): 20 mg via ORAL
  Filled 2011-09-20 (×3): qty 1

## 2011-09-20 MED ORDER — ONDANSETRON HCL 4 MG/2ML IJ SOLN
4.0000 mg | Freq: Once | INTRAMUSCULAR | Status: AC
  Administered 2011-09-20: 4 mg via INTRAVENOUS

## 2011-09-20 MED ORDER — GLYCOPYRROLATE 0.2 MG/ML IJ SOLN
INTRAMUSCULAR | Status: DC | PRN
  Administered 2011-09-20: .6 mg via INTRAVENOUS

## 2011-09-20 MED ORDER — ONDANSETRON HCL 4 MG/2ML IJ SOLN: 4.0000 mg | Freq: Once | INTRAMUSCULAR | Status: DC | PRN

## 2011-09-20 MED ORDER — BUPIVACAINE HCL (PF) 0.5 % IJ SOLN
INTRAMUSCULAR | Status: AC
  Filled 2011-09-20: qty 30

## 2011-09-20 MED ORDER — ONDANSETRON HCL 4 MG/2ML IJ SOLN: 4.0000 mg | Freq: Four times a day (QID) | INTRAMUSCULAR | Status: DC | PRN

## 2011-09-20 MED ORDER — HYDRALAZINE HCL 20 MG/ML IJ SOLN
INTRAMUSCULAR | Status: AC
  Filled 2011-09-20: qty 1

## 2011-09-20 MED ORDER — BUPIVACAINE HCL (PF) 0.5 % IJ SOLN
INTRAMUSCULAR | Status: DC | PRN
  Administered 2011-09-20: 10 mL

## 2011-09-20 MED ORDER — GLYCOPYRROLATE 0.2 MG/ML IJ SOLN
INTRAMUSCULAR | Status: AC
  Filled 2011-09-20: qty 1

## 2011-09-20 MED ORDER — MIDAZOLAM HCL 2 MG/2ML IJ SOLN
INTRAMUSCULAR | Status: AC
  Filled 2011-09-20: qty 2

## 2011-09-20 MED ORDER — HYDROCODONE-ACETAMINOPHEN 5-325 MG PO TABS
1.0000 | ORAL_TABLET | ORAL | Status: DC | PRN
  Administered 2011-09-20 (×2): 1 via ORAL
  Administered 2011-09-21 (×2): 2 via ORAL
  Administered 2011-09-21 – 2011-09-22 (×3): 1 via ORAL
  Filled 2011-09-20 (×3): qty 1
  Filled 2011-09-20: qty 2
  Filled 2011-09-20 (×2): qty 1
  Filled 2011-09-20: qty 2

## 2011-09-20 SURGICAL SUPPLY — 38 items
APL SKNCLS STERI-STRIP NONHPOA (GAUZE/BANDAGES/DRESSINGS) ×1
APPLIER CLIP UNV 5X34 EPIX (ENDOMECHANICALS) ×2 IMPLANT
APR XCLPCLP 20M/L UNV 34X5 (ENDOMECHANICALS) ×1
BAG HAMPER (MISCELLANEOUS) ×2 IMPLANT
BAG SPEC RTRVL LRG 6X4 10 (ENDOMECHANICALS) ×1
BENZOIN TINCTURE PRP APPL 2/3 (GAUZE/BANDAGES/DRESSINGS) ×2 IMPLANT
CLOTH BEACON ORANGE TIMEOUT ST (SAFETY) ×2 IMPLANT
COVER LIGHT HANDLE STERIS (MISCELLANEOUS) ×4 IMPLANT
DECANTER SPIKE VIAL GLASS SM (MISCELLANEOUS) ×2 IMPLANT
DEVICE TROCAR PUNCTURE CLOSURE (ENDOMECHANICALS) ×2 IMPLANT
DURAPREP 26ML APPLICATOR (WOUND CARE) ×2 IMPLANT
ELECT REM PT RETURN 9FT ADLT (ELECTROSURGICAL) ×2
ELECTRODE REM PT RTRN 9FT ADLT (ELECTROSURGICAL) ×1 IMPLANT
FILTER SMOKE EVAC LAPAROSHD (FILTER) ×2 IMPLANT
FORMALIN 10 PREFIL 120ML (MISCELLANEOUS) ×2 IMPLANT
GLOVE BIO SURGEON STRL SZ 6.5 (GLOVE) ×2 IMPLANT
GLOVE BIOGEL PI IND STRL 7.5 (GLOVE) ×1 IMPLANT
GLOVE BIOGEL PI INDICATOR 7.5 (GLOVE) ×1
GLOVE ECLIPSE 6.5 STRL STRAW (GLOVE) ×4 IMPLANT
GLOVE ECLIPSE 7.0 STRL STRAW (GLOVE) ×2 IMPLANT
GLOVE EXAM NITRILE MD LF STRL (GLOVE) ×2 IMPLANT
GLOVE INDICATOR 7.0 STRL GRN (GLOVE) ×4 IMPLANT
GOWN STRL REIN XL XLG (GOWN DISPOSABLE) ×8 IMPLANT
HEMOSTAT SNOW SURGICEL 2X4 (HEMOSTASIS) IMPLANT
INST SET LAPROSCOPIC AP (KITS) ×2 IMPLANT
IV NS IRRIG 3000ML ARTHROMATIC (IV SOLUTION) IMPLANT
KIT ROOM TURNOVER APOR (KITS) ×2 IMPLANT
KIT TROCAR LAP CHOLE (TROCAR) ×2 IMPLANT
MANIFOLD NEPTUNE II (INSTRUMENTS) ×2 IMPLANT
PACK LAP CHOLE LZT030E (CUSTOM PROCEDURE TRAY) ×2 IMPLANT
PAD ARMBOARD 7.5X6 YLW CONV (MISCELLANEOUS) ×2 IMPLANT
POUCH SPECIMEN RETRIEVAL 10MM (ENDOMECHANICALS) ×2 IMPLANT
SET BASIN LINEN APH (SET/KITS/TRAYS/PACK) ×2 IMPLANT
SET TUBE IRRIG SUCTION NO TIP (IRRIGATION / IRRIGATOR) IMPLANT
STRIP CLOSURE SKIN 1/2X4 (GAUZE/BANDAGES/DRESSINGS) ×2 IMPLANT
SUT MNCRL AB 4-0 PS2 18 (SUTURE) ×4 IMPLANT
SUT VIC AB 2-0 CT2 27 (SUTURE) ×2 IMPLANT
WARMER LAPAROSCOPE (MISCELLANEOUS) ×2 IMPLANT

## 2011-09-20 NOTE — Op Note (Signed)
Patient:  [name]  DOB:  [birth year]  MRN:  [id number]   Preop Diagnosis:  Biliary dyskinesia  Postop Diagnosis:  The same  Procedure:  Laparoscopic cholecystectomy  Surgeon:  Tilford Pillar  Anes:  General endotracheal, 0.5 Sensorcaine plain for local  Indications:   Patient is a 78 year old female presented to my office with a history of epigastric abdominal pain. Workup and evaluation was consistent for biliary dyskinesia. Risks benefits and alternatives of a laparoscopic possible open cholecystectomy were discussed at length with the patient including but not limited to risk of bleeding, infection, bile leak, common bile duct injury, small bowel injury, intraoperative cardiac and pulmonary events. Patient's questions and concerns were addressed the patient was consented for the planned procedure.  Procedure note:  Patient is taken to the OR space the supine position on the OR table which time the general anesthetic is a Optician, dispensing. At this point the nurse anesthetist intubated the patient with endotracheal tube. Her abdomen is then prepped with DuraPrep solution and draped in standard fashion. A stab incision was created supraumbilically with 11 blade scalpel. Additional dissection was carried out using a Coker clamp which is utilized to grasp the anterior bowel wall fascia and lift this anteriorly. A Veress needle is inserted saline drop test is utilized confirm intraperitoneal place and then pneumoperitoneum was initiated. Once sufficient pneumoperitoneum was obtained an 11 mm trochars inserted over a lap scope allowing visualization the trocar entering into the peritoneal cavity. At this point the inner cannulas removed laparoscopic was reinserted there is no is a trocar peritoneal placement injury. The remaining trochars replaced this point with a 5 mm in the epigastrium, a 5 mm trocar in the midline, and a 5 mm in the right lateral abdominal wall. The patient was placed into reverse  Trendelenburg left lateral decubitus position. The fundus of the gallbladder is grasped and the stomach and over the right lobe liver. Blunt dissection is carried out to bluntly stripped the peritoneal reflection off the infundibulum. This exposes the cystic duct and cystic artery is entered into the infundibulum. A window was created behind both structures. 3 endoclips were placed possibly one distally and the cystic duct which was divided between the 2 most distal clips. 2 endoclips placed proximally one distally and the cystic artery divided between 2 most distal clips. A letter cautery was then utilized dissect the gallbladder free from the gallbladder fossa. Once gallbladder free is placed into an Endo Catch bag after having exchanged the 10 mm scope for a 5 mm scope. The Endo Catch bag was placed into the right lower quadrant. At this point inspection the gallbladder fossa demonstrated excellent hemostasis. The endoclips were noted to be in excellent position with no SA bleeding or bile leak. At this time attention was turned to closure.  Using Endo Close suture passing device a 2-0 Vicryl sutures passed to the umbilical trocar site. With this suture and placed the gallbladder was retrieved was removed through the umbilical trocar site and intact Endo Catch bag. The gallbladder is sent as a permanent specimen to pathology. At this point the pneumoperitoneum was evacuated. Trochars removed. The Vicryl sutures secured. Local anesthetic is instilled. A 4-0 Monocryl utilized reattachment the skin edges at all 4 trocar sites. The skin was washed dried moist dry towel. Benzoin is applied around incision. Half-inch Steri-Strips are placed. The drapes removed. The patient was allowed to mild general anesthetic and stretcher the PACU in stable condition. At the conclusion of procedure all  instrument, sponge, needle counts are correct. Patient tolerated procedure extremely well.  Complications:  None  apparent  EBL:  Scant  Specimen:  Gallbladder

## 2011-09-20 NOTE — Interval H&P Note (Signed)
History and Physical Interval Note:  09/20/2011 11:33 AM  [name]  has presented today for surgery, with the diagnosis of Calculus of gallbladder with other cholecystitis, without mention of obstruction   The various methods of treatment have been discussed with the patient and family. After consideration of risks, benefits and other options for treatment, the patient has consented to  Procedure(s) (LRB): LAPAROSCOPIC CHOLECYSTECTOMY (N/A) as a surgical intervention .  The patients' history has been reviewed, patient examined, no change in status, stable for surgery.  I have reviewed the patients' chart and labs.  Questions were answered to the patient's satisfaction.     Burr Soffer C

## 2011-09-20 NOTE — Transfer of Care (Signed)
Immediate Anesthesia Transfer of Care Note  Patient: [name]  Procedure(s) Performed: Procedure(s) (LRB): LAPAROSCOPIC CHOLECYSTECTOMY (N/A)  Patient Location: PACU  Anesthesia Type: General  Level of Consciousness: awake, alert  and oriented  Airway & Oxygen Therapy: Patient Spontanous Breathing and Patient connected to face mask oxygen  Post-op Assessment: Report given to PACU RN  Post vital signs: Reviewed and stable  Complications: No apparent anesthesia complications

## 2011-09-20 NOTE — Anesthesia Postprocedure Evaluation (Signed)
  Anesthesia Post-op Note  Patient: [name]  Procedure(s) Performed: Procedure(s) (LRB): LAPAROSCOPIC CHOLECYSTECTOMY (N/A)  Patient Location: PACU  Anesthesia Type: General  Level of Consciousness: awake, alert  and oriented  Airway and Oxygen Therapy: Patient Spontanous Breathing  Post-op Pain: mild  Post-op Assessment: Post-op Vital signs reviewed, Patient's Cardiovascular Status Stable, Respiratory Function Stable and Patent Airway  Post-op Vital Signs: Reviewed and stable  Complications: No apparent anesthesia complications

## 2011-09-20 NOTE — Anesthesia Procedure Notes (Signed)
Procedure Name: Intubation Date/Time: 09/20/2011 12:25 PM Performed by: Glynn Octave Pre-anesthesia Checklist: Patient identified, Patient being monitored, Timeout performed, Emergency Drugs available and Suction available Patient Re-evaluated:Patient Re-evaluated prior to inductionOxygen Delivery Method: Circle System Utilized Preoxygenation: Pre-oxygenation with 100% oxygen Intubation Type: IV induction, Rapid sequence and Cricoid Pressure applied Ventilation: Mask ventilation without difficulty Laryngoscope Size: Mac and 3 Grade View: Grade II Tube type: Oral Tube size: 7.0 mm Number of attempts: 1 Airway Equipment and Method: stylet Placement Confirmation: ETT inserted through vocal cords under direct vision,  positive ETCO2 and breath sounds checked- equal and bilateral Secured at: 21 cm Tube secured with: Tape Dental Injury: Teeth and Oropharynx as per pre-operative assessment

## 2011-09-20 NOTE — Anesthesia Preprocedure Evaluation (Addendum)
Anesthesia Evaluation  Patient identified by MRN, date of birth, ID band Patient awake    Reviewed: Allergy & Precautions, H&P , NPO status , Patient's Chart, lab work & pertinent test results  Airway Mallampati: I TM Distance: >3 FB Neck ROM: Full    Dental  (+) Edentulous Upper   Pulmonary shortness of breath, COPD   Pulmonary exam normal       Cardiovascular hypertension, Pt. on medications +CHF + dysrhythmias (pacemaker) Regular Normal+ Peripheral Edema    Neuro/Psych  Neuromuscular disease Negative Psych ROS   GI/Hepatic GERD-  Medicated and Controlled,  Endo/Other    Renal/GU   Genitourinary negative   Musculoskeletal  (+) Arthritis -, Osteoarthritis,    Abdominal (+) obese,  Abdomen: soft.    Peds  Hematology   Anesthesia Other Findings Pacemaker with 1:1 capture  Reproductive/Obstetrics                          Anesthesia Physical Anesthesia Plan  ASA: III  Anesthesia Plan: General   Post-op Pain Management:    Induction: Intravenous, Rapid sequence and Cricoid pressure planned  Airway Management Planned: Oral ETT  Additional Equipment:   Intra-op Plan:   Post-operative Plan: Extubation in OR  Informed Consent: I have reviewed the patients History and Physical, chart, labs and discussed the procedure including the risks, benefits and alternatives for the proposed anesthesia with the patient or authorized representative who has indicated his/her understanding and acceptance.     Plan Discussed with: CRNA  Anesthesia Plan Comments:         Anesthesia Quick Evaluation

## 2011-09-20 NOTE — H&P (Signed)
NTS SOAP Note  Vital Signs:  Vitals as of: 09/10/2011: Systolic 213: Diastolic 96: Heart Rate 70: Temp 98.46F: Height 28ft 3in: Weight 149Lbs 0 Ounces: OFC 0in: Respiratory Rate 0: O2 Saturation 0: Pain Level 0: BMI 26  BMI : 26.39 kg/m2  Subjective: This 31 Years 1 Months old Female presents for of RUQ pain.  First noted about 1 month ago.  Worse with spicey foods.  ? fatty foods.  + belching, bloating.  Occ non-bloody emesis.  No history of PUD.  No EGD.  Has had laryngeoscopy for polyps.  No change in BM.  No fever or chills.  No jaundice.  No significant family history of biliary disease.  Review of Symptoms:  Constitutional:unremarkable Head:unremarkable Headache Eyes:unremarkable occ sore throat Cardiovascular:unremarkable wheezing, cough as per HPI Genitourinary:unremarkable arthralgia dry Breast:unremarkable Hematolgic/Lymphatic:unremarkable Allergic/Immunologic:unremarkable   Past Medical History:Obtained   Past Medical History  Surgical History: Cataracts Medical Problems: HTN, Hypercholesterolemia, GERD Psychiatric History: none Allergies: PCN Medications: Benazepril, atorvastatin, omeprazole, amlodipine, clopidogrel   Social History:Obtained   Social History  No EtOH No rec drugs   Smoking Status: Current every day smoker reviewed on 09/12/2011 Started Date: 08/06/1949 Packs per day: 0.50   Family History:Obtained   Family History  Cancer, CAD, DM    Objective Information: General:Well appearing, well nourished in no distress.elderly Skin:unremarkableno rash or prominent lesions Head:Atraumatic; no masses; no abnormalities Eyes:conjunctiva clear, EOM intact, PERRL Mouth:Mucous membranes moist, no mucosal lesions. Neck:Supple without lymphadenopathy.  Heart:RRR, no murmur Lungs:CTA bilaterally, no wheezes, rhonchi, rales.  Breathing unlabored. Abdomen:Soft, ND, no  HSM, no masses.Mild RUQ tenderness.  Assessment:  Diagnosis &amp; Procedure: DiagnosisCode: 574.00, ProcedureCode: 84166,    Plan: Cholelithiasis.  Options discussed with the patient.  Will need to be off Plavix 5-7 days prior and will need cardiac clearence.  Once cleared will schedule at her convenience.  Patient to call with issues.  Patient Education:Alternative treatments to surgery were discussed with patient (and family).Risks and benefits  of procedure were fully explained to the patient (and family) who gave informed consent. Patient/family questions were addressed.  Follow-up:Pending Surgery

## 2011-09-20 NOTE — OR Nursing (Signed)
Per Dr. Alver Fisher office pt. Has a pacemaker not a defibrillator..  Placed in 2008

## 2011-09-21 MED ORDER — SODIUM CHLORIDE 0.9 % IJ SOLN
INTRAMUSCULAR | Status: AC
  Filled 2011-09-21: qty 12

## 2011-09-21 NOTE — Progress Notes (Signed)
Umbilical surgical site clean and dry and dressing clean, dry, and intact with no active bleeding.

## 2011-09-21 NOTE — Progress Notes (Addendum)
Patient's incision at the umbilicus has been oozing blood onto towel over her abdomen. Doctor was notified and RN told to put gauze on it and that doctor will see patient in the morning. Will continue to monitor.

## 2011-09-21 NOTE — Progress Notes (Signed)
Paged Dr. Leticia Penna about giving Lovenox due to bleeding from surgical incision.

## 2011-09-21 NOTE — Progress Notes (Signed)
RN put gauze on patient's oozing incision. Patient bleed through gauze in two hours and gauze changed. RN will make day RN aware that doctor knows and is coming in this AM to check the patient's incisions. Will continue to monitor.

## 2011-09-22 MED ORDER — HYDROCODONE-ACETAMINOPHEN 5-325 MG PO TABS: 1.0000 | ORAL_TABLET | ORAL | Status: AC | PRN

## 2011-09-22 NOTE — Progress Notes (Signed)
POD #1  Subjective: Patient seen yesterday, late entry note.  Overall patient feels well with no significant abdominal pain. Pain is controlled with pain medication. She denies any nausea. She is tolerating a diet. She continues to have some drainage from her umbilical incision in midline trocar site. This is been reinforced by the nurse several times overnight.  When asked regarding the discontinuation of her Plavix she does state that this is been over 7 days ago as discussed. But on further discussion she states she had to significant episodes of epistasis in the past which required what she describes as electrocautery to control the bleeding. These were in the distant past and therefore she did not think much about this.  Objective: Vital signs in last 24 hours: Temp:  [98.1 F (36.7 C)-99 F (37.2 C)] 98.5 F (36.9 C) (02/17 0509) Pulse Rate:  [69-74] 74  (02/17 0509) Resp:  [20] 20  (02/17 0509) BP: (132-155)/(64-71) 133/71 mmHg (02/17 0509) SpO2:  [93 %-94 %] 94 % (02/17 0509) Last BM Date: 09/20/11  Intake/Output from previous day: 02/16 0701 - 02/17 0700 In: 800 [P.O.:800] Out: 3025 [Urine:3025] Intake/Output this shift: Total I/O In: 480 [P.O.:480] Out: 450 [Urine:450]  General appearance: alert and no distress Resp: clear to auscultation bilaterally GI: Positive bowel sounds, soft, obese, mild ecchymosis around the incisions. Expected tenderness around the incision sites. No diffuse peritoneal signs. Incision/Wound:  The umbilical incision does have bloodsoaked Steri-Strips over it. These are removed and demonstrated a slow skin is along the right lateral aspect of the incision.  Lab Results:  No results found for this basename: WBC:2,HGB:2,HCT:2,PLT:2 in the last 72 hours BMET No results found for this basename: NA:2,K:2,CL:2,CO2:2,GLUCOSE:2,BUN:2,CREATININE:2,CALCIUM:2 in the last 72 hours PT/INR No results found for this basename: LABPROT:2,INR:2 in the last 72  hours ABG No results found for this basename: PHART:2,PCO2:2,PO2:2,HCO3:2 in the last 72 hours  Studies/Results: No results found.  Anti-infectives: Anti-infectives     Start     Dose/Rate Route Frequency Ordered Stop   09/20/11 1100   ciprofloxacin (CIPRO) IVPB 400 mg  Status:  Discontinued        400 mg 200 mL/hr over 60 Minutes Intravenous 120 min pre-op 09/20/11 1056 09/20/11 1536          Assessment/Plan: s/p Procedure(s) (LRB): LAPAROSCOPIC CHOLECYSTECTOMY (N/A) Overall patient is doing well. I did reassure both the patient and the daughter who is present during the discussion and evaluation regarding the slow skin bleed from the incision. I did personally held pressure for approximately 15 minutes continuously which didn't appear to slow this down significantly. In order to assist with hemostasis I also applied some silver nitrate to the skin edge which created hemostasis. A rolled gauze 4 x 4 gauze dressing was then placed over this area and secured with Medipore tape in a tight fashion to assist with some pressure in this area. Although I feel hemostasis has been obtained we'll watch the patient overnight to ensure that the skin later does not resume. Should he continue to she may require either electrocautery or a additional skin suture to help with hemostasis. I did discuss this with the patient and daughter. Hopefully patient will be able to discharge tomorrow.  Gaylin Osoria C 09/22/2011

## 2011-09-22 NOTE — Discharge Summary (Signed)
Physician Discharge Summary  Patient ID: [name] MRN: [id number] DOB/AGE: 1930-05-20 78 y.o.  Admit date: 09/20/2011 Discharge date: 09/22/2011  Admission Diagnoses: Chronic cholecystitis  Discharge Diagnoses: The same,  suspected underlying coagulopathy. Active Problems:  * No active hospital problems. *    Discharged Condition: stable  Hospital Course: Patient presented on 09-20-11 for planned laparoscopic cholecystectomy. Patient had some postoperative pain issues as well as some nausea in the recovery area in due to her end-stage was admitted for overnight observation. Overnight she developed bleeding from her umbilical incision site. This was reinforced several times but on evaluation the following day continue to have a slow skin bleed. This was controlled with silver nitrate and compression. She was watched for another 24 hours and at this point remained stable. Patient's comfortable with the idea of discharged home.  Consults: None  Significant Diagnostic Studies: None  Treatments: surgery: Laparoscopic cholecystectomy  Discharge Exam: Blood pressure 133/71, pulse 74, temperature 98.5 F (36.9 C), temperature source Oral, resp. rate 20, height 5\' 3"  (1.6 m), SpO2 94.00%. General appearance: alert and no distress Resp: clear to auscultation bilaterally Cardio: regular rate and rhythm GI: Positive bowel sounds, soft, obese, moderate expected tenderness. No diffuse peritoneal signs. Incisions are clean dry and intact. There is some slight skin sloughing at the umbilical trocar site secondarily to the applied silver nitrate yesterday.  Disposition: Home or Self Care  Discharge Orders    Future Appointments: Provider: Department: Dept Phone: Center:   10/29/2011 9:15 AM Fuller Canada, MD [facility] [phone] [facility]     Future Orders Please Complete By Expires   Diet - low sodium heart healthy      Diet - low sodium heart healthy      Increase activity  slowly      Discharge instructions      Comments:   Increase activity as tolerated. May place ice pack for comfort.    Driving Restrictions      Comments:   No driving while on pain medications.    Lifting restrictions      Comments:   No lifting over 20lbs for 4-5 weeks post-op.    Discharge wound care:      Comments:   Clean surgical sites with soap and water.  May shower the morning after surgery unless instructed by Dr. Leticia Penna otherwise.  No soaking for 2-3 weeks.    If adhesive strips are in place, they may be removed in 1-2 weeks while in the shower.    Call MD for:  temperature >100.4      Call MD for:  persistant nausea and vomiting      Call MD for:  severe uncontrolled pain      Call MD for:  redness, tenderness, or signs of infection (pain, swelling, redness, odor or green/yellow discharge around incision site)      Increase activity slowly      Discharge instructions      Comments:   Increase activity as tolerated. May place ice pack for comfort.    Driving Restrictions      Comments:   No driving while on pain medications.    Lifting restrictions      Comments:   No lifting over 20lbs for 4-5 weeks post-op.    Discharge wound care:      Comments:   Clean surgical sites with soap and water.  May shower the morning after surgery unless instructed by Dr. Leticia Penna otherwise.  No soaking for  2-3 weeks.    If adhesive strips are in place, they may be removed in 1-2 weeks while in the shower.    Call MD for:      Scheduling Instructions:   New bleeding   Call MD for:  temperature >100.4      Call MD for:  persistant nausea and vomiting      Call MD for:  severe uncontrolled pain      Call MD for:  redness, tenderness, or signs of infection (pain, swelling, redness, odor or green/yellow discharge around incision site)        Medication List  As of 09/22/2011  1:03 PM   TAKE these medications         ALPRAZolam 0.5 MG tablet   Commonly known as: XANAX       atorvastatin 40 MG tablet   Commonly known as: LIPITOR      CENTRUM tablet   Take 1 tablet by mouth daily.      cholecalciferol 1000 UNITS tablet   Commonly known as: VITAMIN D   Take 1,000 Units by mouth once a week.      clopidogrel 75 MG tablet   Commonly known as: PLAVIX   Take 75 mg by mouth daily.      esomeprazole 40 MG capsule   Commonly known as: NEXIUM   Take 40 mg by mouth daily before breakfast.      furosemide 20 MG tablet   Commonly known as: LASIX   Take 20 mg by mouth as needed. Patient monitors swelling and takes furosemide as needed      HYDROcodone-acetaminophen 5-325 MG per tablet   Commonly known as: NORCO   Take 1-2 tablets by mouth every 4 (four) hours as needed for pain.      losartan 100 MG tablet   Commonly known as: COZAAR   Take 100 mg by mouth daily.      methylPREDNISolone acetate 40 MG/ML injection   Commonly known as: DEPO-MEDROL   Inject 40 mg into the articular space as needed. Dr. Romeo Apple provides knee injections as needed for pain. Last dose 07/2011      omeprazole 40 MG capsule   Commonly known as: PRILOSEC      OVER THE COUNTER MEDICATION   Apply 1 application topically daily. Topical lotion used for dry skin on arms and hands      PROAIR HFA 108 (90 BASE) MCG/ACT inhaler   Generic drug: albuterol           Follow-up Information    Follow up with Millette Halberstam C, MD in 2 weeks.   Contact information:   [address] [place] [address] [phone]          Signed: Fabio Bering 09/22/2011, 1:03 PM

## 2011-09-22 NOTE — Progress Notes (Signed)
2 Days Post-Op  Subjective: Feels better. Continues to tolerate diet. Pain control. No bleeding since addressed yesterday.  Objective: Vital signs in last 24 hours: Temp:  [98.1 F (36.7 C)-99 F (37.2 C)] 98.5 F (36.9 C) (02/17 0509) Pulse Rate:  [69-74] 74  (02/17 0509) Resp:  [20] 20  (02/17 0509) BP: (132-155)/(64-71) 133/71 mmHg (02/17 0509) SpO2:  [93 %-94 %] 94 % (02/17 0509) Last BM Date: 09/20/11  Intake/Output from previous day: 02/16 0701 - 02/17 0700 In: 800 [P.O.:800] Out: 3025 [Urine:3025] Intake/Output this shift: Total I/O In: 480 [P.O.:480] Out: 450 [Urine:450]  General appearance: alert and no distress GI: Positive bowel sounds, soft, obese, some ecchymotic changes around all 4 incisions. No active bleeding. Dressings are dry. Expected tenderness. No peritoneal signs.  Lab Results:  No results found for this basename: WBC:2,HGB:2,HCT:2,PLT:2 in the last 72 hours BMET No results found for this basename: NA:2,K:2,CL:2,CO2:2,GLUCOSE:2,BUN:2,CREATININE:2,CALCIUM:2 in the last 72 hours PT/INR No results found for this basename: LABPROT:2,INR:2 in the last 72 hours ABG No results found for this basename: PHART:2,PCO2:2,PO2:2,HCO3:2 in the last 72 hours  Studies/Results: No results found.  Anti-infectives: Anti-infectives     Start     Dose/Rate Route Frequency Ordered Stop   09/20/11 1100   ciprofloxacin (CIPRO) IVPB 400 mg  Status:  Discontinued        400 mg 200 mL/hr over 60 Minutes Intravenous 120 min pre-op 09/20/11 1056 09/20/11 1536          Assessment/Plan: s/p Procedure(s) (LRB): LAPAROSCOPIC CHOLECYSTECTOMY (N/A) Doing well. Reassured daughter. We'll plan to proceed with discharge. Wound care was discussed with patient. Will have patient followup in 2 weeks.  LOS: 2 days    Kenita Bines C 09/22/2011

## 2011-09-22 NOTE — Progress Notes (Signed)
Patient given discharge instructions and prescriptions. Patient voiced understanding of discharge instructions. Patient in stable condition. Patient escorted by wheelchair.

## 2011-09-25 ENCOUNTER — Encounter (HOSPITAL_COMMUNITY): Payer: Self-pay | Admitting: General Surgery

## 2011-10-29 ENCOUNTER — Ambulatory Visit (INDEPENDENT_AMBULATORY_CARE_PROVIDER_SITE_OTHER): Payer: Medicare Other | Admitting: Orthopedic Surgery

## 2011-10-29 ENCOUNTER — Encounter: Payer: Self-pay | Admitting: Orthopedic Surgery

## 2011-10-29 VITALS — Ht 63.0 in | Wt 143.0 lb

## 2011-10-29 DIAGNOSIS — M629 Disorder of muscle, unspecified: Secondary | ICD-10-CM

## 2011-10-29 NOTE — Patient Instructions (Signed)
No specific instructions 

## 2011-10-29 NOTE — Progress Notes (Signed)
Patient ID: [name], female   DOB: August 24, 1929, 78 y.o.   MRN: [id number] Chief Complaint  Patient presents with  . Follow-up    3 month recheck on left knee.     LEFT knee improved. She is asymptomatic.  Normal range of motion.  Discharge

## 2012-04-14 ENCOUNTER — Other Ambulatory Visit (HOSPITAL_COMMUNITY): Payer: Self-pay | Admitting: Family Medicine

## 2012-04-14 ENCOUNTER — Ambulatory Visit (HOSPITAL_COMMUNITY)
Admission: RE | Admit: 2012-04-14 | Discharge: 2012-04-14 | Disposition: A | Discharge status: Home / Self Care | Payer: Medicare Other | Source: Ambulatory Visit | Attending: Family Medicine | Admitting: Family Medicine

## 2012-04-14 DIAGNOSIS — R05 Cough: Secondary | ICD-10-CM | POA: Insufficient documentation

## 2012-04-14 DIAGNOSIS — J069 Acute upper respiratory infection, unspecified: Secondary | ICD-10-CM

## 2012-04-14 DIAGNOSIS — R059 Cough, unspecified: Secondary | ICD-10-CM | POA: Insufficient documentation

## 2012-05-08 ENCOUNTER — Ambulatory Visit (HOSPITAL_COMMUNITY)
Admission: RE | Admit: 2012-05-08 | Discharge: 2012-05-08 | Disposition: A | Discharge status: Home / Self Care | Payer: Medicare Other | Source: Ambulatory Visit | Attending: Family Medicine | Admitting: Family Medicine

## 2012-05-08 ENCOUNTER — Other Ambulatory Visit (HOSPITAL_COMMUNITY): Payer: Self-pay | Admitting: Family Medicine

## 2012-05-08 DIAGNOSIS — M4120 Other idiopathic scoliosis, site unspecified: Secondary | ICD-10-CM | POA: Insufficient documentation

## 2012-05-08 DIAGNOSIS — M545 Low back pain, unspecified: Secondary | ICD-10-CM | POA: Insufficient documentation

## 2012-05-08 DIAGNOSIS — S335XXA Sprain of ligaments of lumbar spine, initial encounter: Secondary | ICD-10-CM

## 2012-05-29 ENCOUNTER — Other Ambulatory Visit: Payer: Self-pay | Admitting: Dermatology

## 2012-06-02 ENCOUNTER — Ambulatory Visit (INDEPENDENT_AMBULATORY_CARE_PROVIDER_SITE_OTHER): Payer: Medicare Other | Admitting: Urology

## 2012-06-02 DIAGNOSIS — R3129 Other microscopic hematuria: Secondary | ICD-10-CM

## 2012-06-02 DIAGNOSIS — N952 Postmenopausal atrophic vaginitis: Secondary | ICD-10-CM

## 2012-06-18 ENCOUNTER — Other Ambulatory Visit (HOSPITAL_COMMUNITY): Payer: Self-pay | Admitting: *Deleted

## 2012-06-18 DIAGNOSIS — I6529 Occlusion and stenosis of unspecified carotid artery: Secondary | ICD-10-CM

## 2012-06-18 DIAGNOSIS — I70219 Atherosclerosis of native arteries of extremities with intermittent claudication, unspecified extremity: Secondary | ICD-10-CM

## 2012-07-14 ENCOUNTER — Ambulatory Visit (HOSPITAL_COMMUNITY)
Admission: RE | Admit: 2012-07-14 | Discharge: 2012-07-14 | Disposition: A | Discharge status: Home / Self Care | Payer: Medicare Other | Source: Ambulatory Visit | Attending: Cardiovascular Disease | Admitting: Cardiovascular Disease

## 2012-07-14 DIAGNOSIS — I6529 Occlusion and stenosis of unspecified carotid artery: Secondary | ICD-10-CM | POA: Insufficient documentation

## 2012-07-14 DIAGNOSIS — I70219 Atherosclerosis of native arteries of extremities with intermittent claudication, unspecified extremity: Secondary | ICD-10-CM

## 2012-07-14 NOTE — Progress Notes (Signed)
BLE arterial duplex completed. [name]  

## 2012-07-14 NOTE — Progress Notes (Signed)
Carotid Duplex completed. [name] D  

## 2012-09-30 ENCOUNTER — Other Ambulatory Visit: Payer: Self-pay | Admitting: Dermatology

## 2012-11-23 ENCOUNTER — Encounter: Payer: Self-pay | Admitting: *Deleted

## 2013-01-06 ENCOUNTER — Other Ambulatory Visit: Payer: Self-pay | Admitting: Cardiovascular Disease

## 2013-01-06 LAB — COMPREHENSIVE METABOLIC PANEL
AST: 21 U/L (ref 0–37)
Albumin: 4.1 g/dL (ref 3.5–5.2)
BUN: 17 mg/dL (ref 6–23)
Calcium: 8.6 mg/dL (ref 8.4–10.5)
Chloride: 107 mEq/L (ref 96–112)
Creat: 1.09 mg/dL (ref 0.50–1.10)
Glucose, Bld: 95 mg/dL (ref 70–99)
Potassium: 4.2 mEq/L (ref 3.5–5.3)

## 2013-01-06 LAB — CBC WITH DIFFERENTIAL/PLATELET
Basophils Absolute: 0.1 10*3/uL (ref 0.0–0.1)
Eosinophils Relative: 2 % (ref 0–5)
HCT: 37.2 % (ref 36.0–46.0)
Hemoglobin: 12.5 g/dL (ref 12.0–15.0)
Lymphocytes Relative: 20 % (ref 12–46)
MCHC: 33.6 g/dL (ref 30.0–36.0)
MCV: 89.2 fL (ref 78.0–100.0)
Monocytes Absolute: 0.8 10*3/uL (ref 0.1–1.0)
Monocytes Relative: 8 % (ref 3–12)
RDW: 15.4 % (ref 11.5–15.5)
WBC: 10 10*3/uL (ref 4.0–10.5)

## 2013-01-06 LAB — LIPID PANEL
Cholesterol: 131 mg/dL (ref 0–200)
HDL: 46 mg/dL (ref >39)
Total CHOL/HDL Ratio: 2.8 Ratio
Triglycerides: 124 mg/dL (ref <150)

## 2013-01-08 ENCOUNTER — Other Ambulatory Visit: Payer: Self-pay | Admitting: Dermatology

## 2013-01-20 ENCOUNTER — Encounter: Payer: Self-pay | Admitting: Cardiovascular Disease

## 2013-02-08 ENCOUNTER — Telehealth: Payer: Self-pay | Admitting: *Deleted

## 2013-02-08 NOTE — Telephone Encounter (Signed)
Called lab results to pt on 02/08/13

## 2013-03-05 ENCOUNTER — Other Ambulatory Visit: Payer: Self-pay | Admitting: *Deleted

## 2013-03-05 ENCOUNTER — Other Ambulatory Visit: Payer: Self-pay | Admitting: Cardiovascular Disease

## 2013-03-05 DIAGNOSIS — M545 Low back pain: Secondary | ICD-10-CM

## 2013-03-05 DIAGNOSIS — R52 Pain, unspecified: Secondary | ICD-10-CM

## 2013-03-05 LAB — PACEMAKER DEVICE OBSERVATION

## 2013-03-08 ENCOUNTER — Ambulatory Visit
Admission: RE | Admit: 2013-03-08 | Discharge: 2013-03-08 | Disposition: A | Discharge status: Home / Self Care | Payer: Medicare Other | Source: Ambulatory Visit | Attending: Cardiovascular Disease | Admitting: Cardiovascular Disease

## 2013-03-08 ENCOUNTER — Telehealth: Payer: Self-pay | Admitting: Cardiovascular Disease

## 2013-03-08 DIAGNOSIS — R52 Pain, unspecified: Secondary | ICD-10-CM

## 2013-03-09 ENCOUNTER — Other Ambulatory Visit: Payer: Medicare Other

## 2013-03-10 ENCOUNTER — Encounter: Payer: Self-pay | Admitting: Cardiovascular Disease

## 2013-03-11 ENCOUNTER — Telehealth: Payer: Self-pay | Admitting: Cardiovascular Disease

## 2013-03-11 NOTE — Telephone Encounter (Signed)
Patient states that she had x-rays on Tuesday.  Do we have these results?

## 2013-03-11 NOTE — Telephone Encounter (Signed)
Message forwarded to J.C. Wildman, LPN.  

## 2013-03-12 ENCOUNTER — Telehealth: Payer: Self-pay | Admitting: Cardiovascular Disease

## 2013-03-12 NOTE — Telephone Encounter (Signed)
i will look and call pt. When reviewed by dr. Alanda Amass

## 2013-03-12 NOTE — Telephone Encounter (Signed)
Returned call and spoke w/ pt.  Informed results have not been reviewed by MD/PA and nurse will call after they are reviewed.  Pt verbalized understanding and agreed w/ plan.   

## 2013-03-12 NOTE — Telephone Encounter (Signed)
[name] is wanting to know the results of her Xray .Marland KitchenMarland KitchenPlease call     Thanks

## 2013-03-16 ENCOUNTER — Telehealth: Payer: Self-pay | Admitting: Orthopedic Surgery

## 2013-03-16 NOTE — Telephone Encounter (Signed)
Terance Ice called today to request an appointment for back pain.  She went for a pacemaker check and Dr. Gillis Ends ordered a CT scan of her back When she told him she was having a lot of back pain.  Will you look at the CT and advise if to schedule her here.  Her # [phone]

## 2013-03-16 NOTE — Telephone Encounter (Signed)
Message forwarded to J.C. Wildman, LPN.  

## 2013-03-16 NOTE — Telephone Encounter (Signed)
Mrs. [name] is calling again about her ct scan that was done on last Tuesday .Marland Kitchen Please call her on today .Marland Kitchen Thanks

## 2013-03-17 NOTE — Telephone Encounter (Signed)
Pt. Informed that Dr. Alanda Amass will see ct scan in the morning and that i would also send this report to Dr. Phillips Odor for evaluation and poss. Treatment for her backpain.

## 2013-03-17 NOTE — Telephone Encounter (Signed)
[name] is calling back to get her results of her xrays. .. Have called back several times .Marland KitchenMarland KitchenPlease call

## 2013-03-17 NOTE — Telephone Encounter (Signed)
Did the Dr. review the CT scan and advised orthopedic followup?   If yes schedule for appointment if not he can make recommendations as he sees fit

## 2013-03-18 NOTE — Telephone Encounter (Signed)
As of 03/17/13, patient had spoken with the cardiology office (nurse) and was advised that she would be referred to her primary care physician for the referral to be made from primary care doctor, since Dr Romeo Apple does not treat this medical issue.

## 2013-06-11 ENCOUNTER — Telehealth: Payer: Self-pay | Admitting: Internal Medicine

## 2013-06-11 NOTE — Telephone Encounter (Signed)
Called pt re pacer fu, former [facility] pt, pt now lived in [place] and has a dr there/mt

## 2013-07-12 IMAGING — US US ABDOMEN COMPLETE
1 series · 13 of 25 positions shown · non-contrast
Comparison: Multiple exams, including 06/20/2009

CLINICAL DATA: Right upper quadrant abdominal pain.

COMPLETE ABDOMINAL ULTRASOUND

[Series 1: us abdomen complete · 0.23mm/px · 13 of 91 slices shown]
[im 1/91]
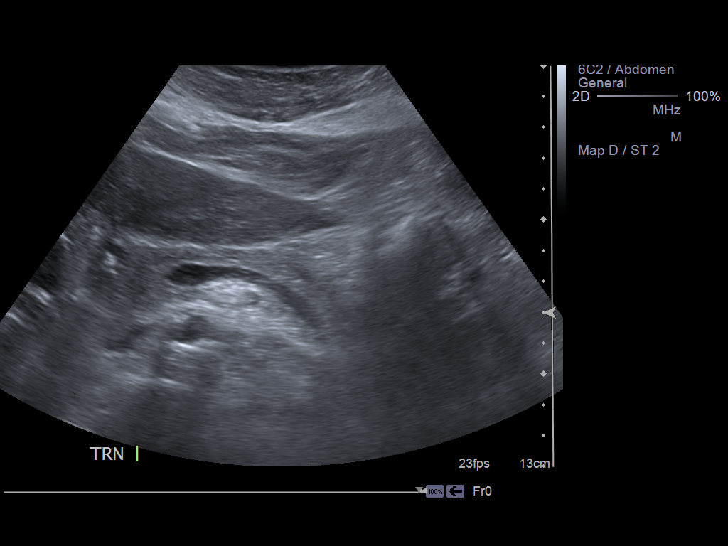
[im 8/91]
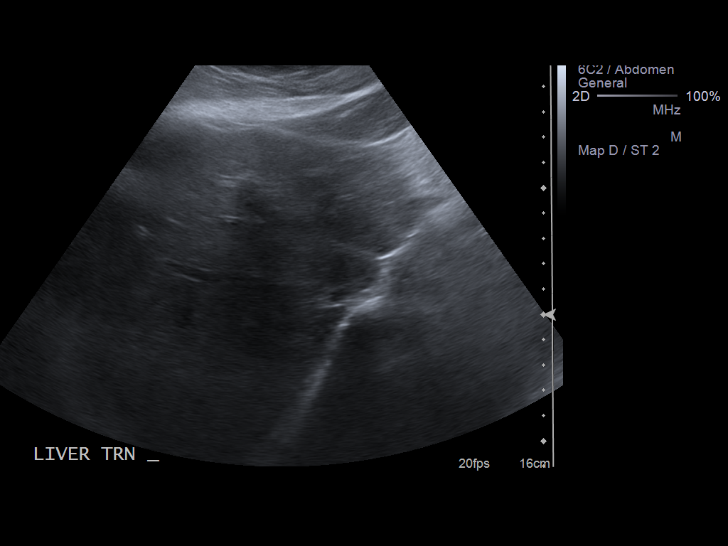
[im 16/91]
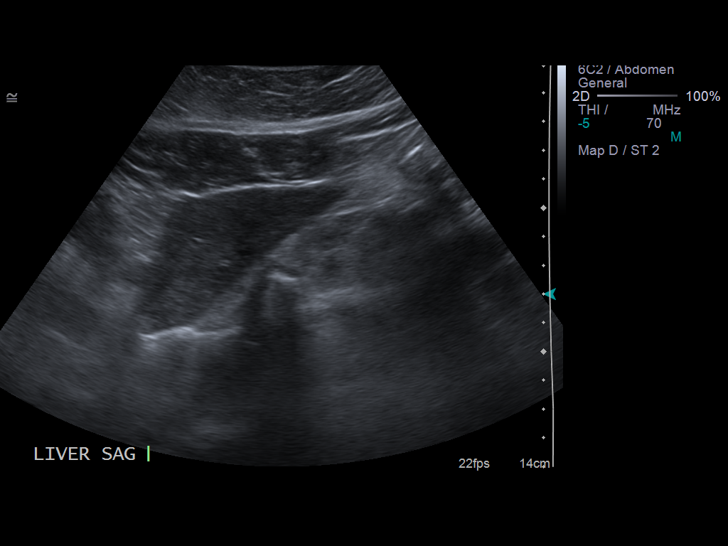
[im 23/91]
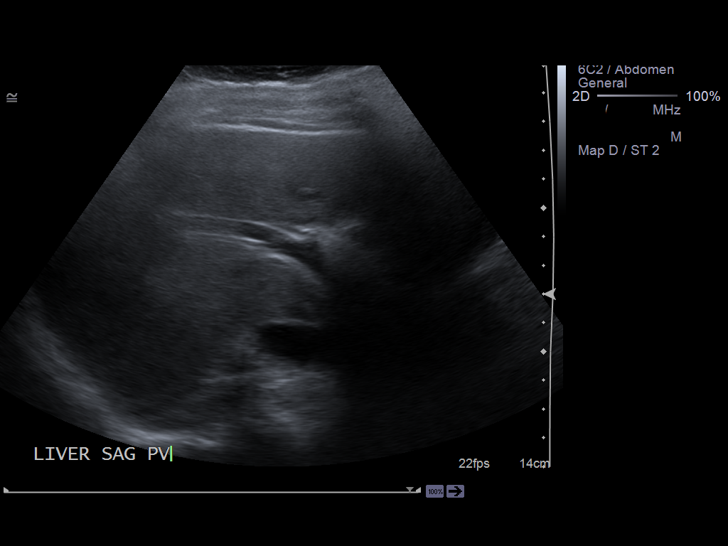
[im 31/91]
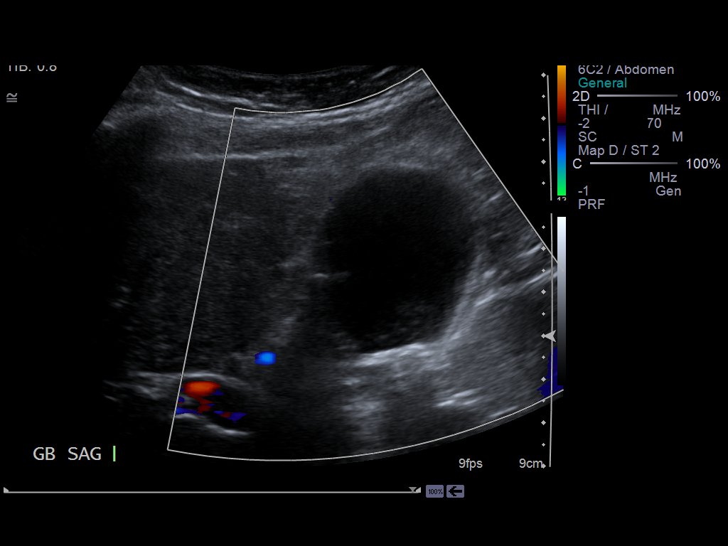
[im 38/91]
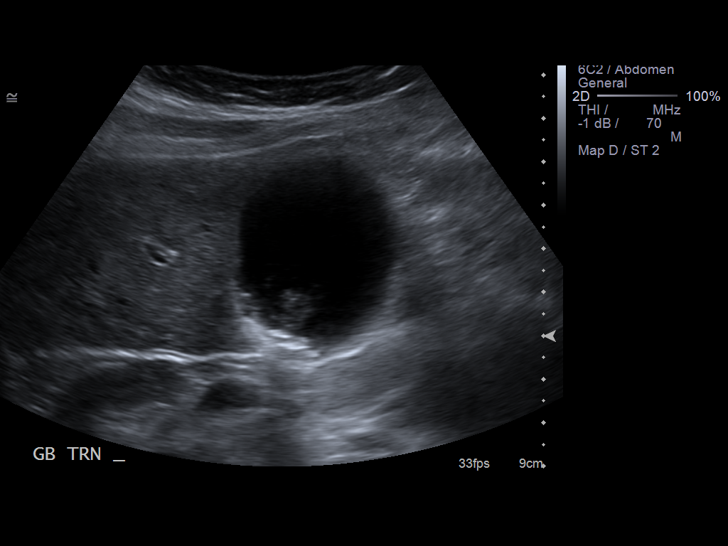
[im 46/91]
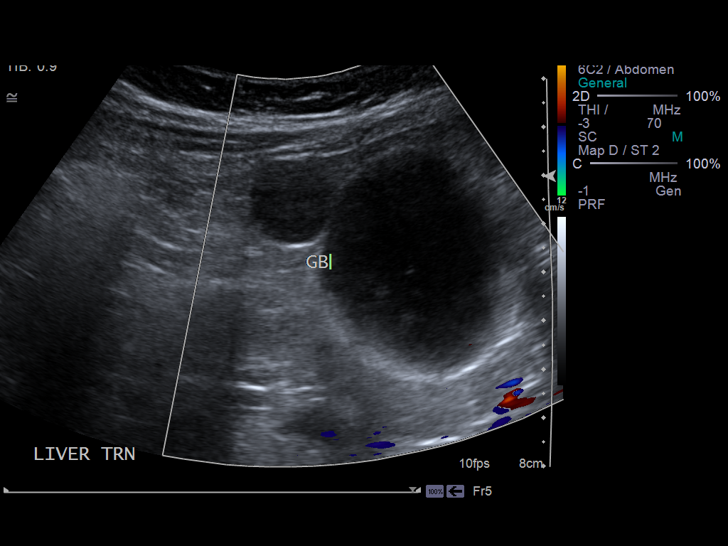
[im 53/91]
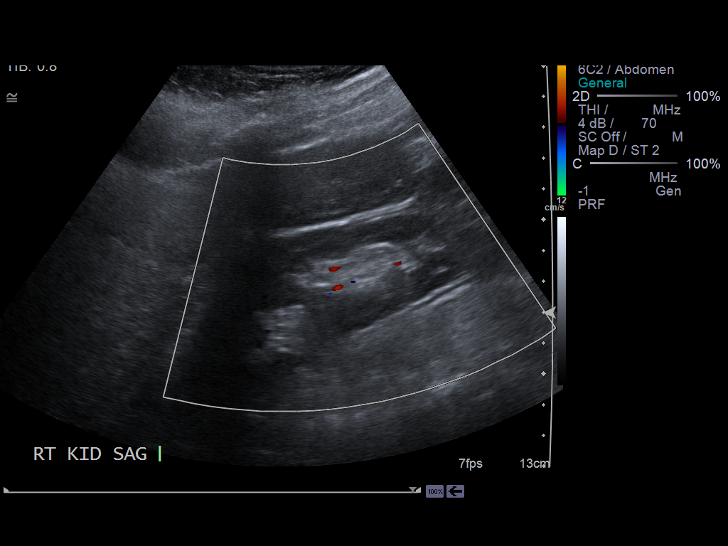
[im 61/91]
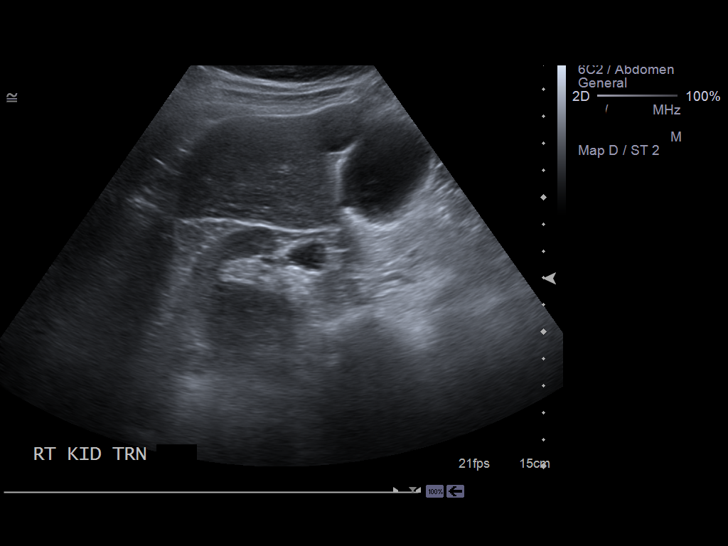
[im 68/91]
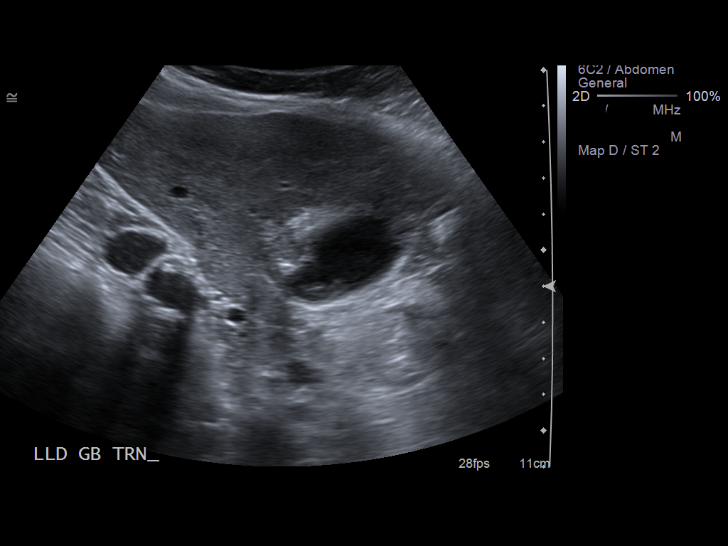
[im 76/91]
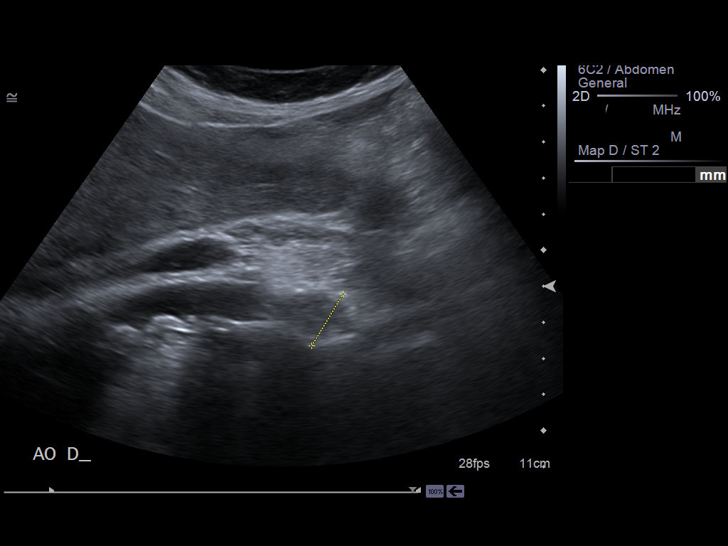
[im 83/91]
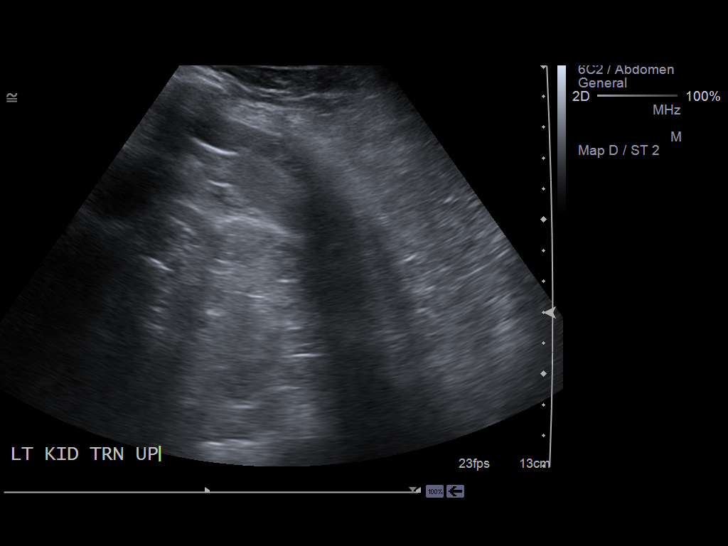
[im 91/91]
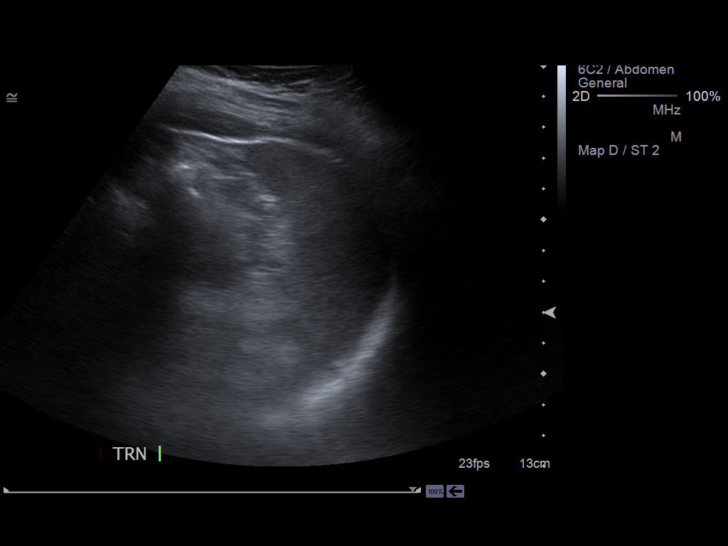

[13 of 25 positions shown; findings below may reference images not displayed]

FINDINGS: Gallbladder:  Multiple stones noted in the gallbladder including a
1.6 cm gallbladder neck calculus.  Sludge is present in the
gallbladder.  There is borderline gallbladder wall thickening at 3
mm.  Sonographic Murphy's sign is present.  No pericholecystic
fluid.

Common bile duct:  Measures 6 mm in diameter, within normal limits
for age.

Liver:  Several benign appearing cysts are present in the liver,
measuring up to 2 cm in diameter.  Otherwise unremarkable.

IVC:  Appears normal.

Pancreas:  No focal abnormality seen.

Spleen:  Measures 6.8 cm in length and appears normal.

Right Kidney:  Measures 10.2 cm in length and appears normal.

Left Kidney:  Measures 10.6 cm in length and appears normal.

Abdominal aorta:  Atherosclerotic calcification noted, without
aneurysm.
IMPRESSION: 1.  Cholelithiasis, also with sludge in the gallbladder.  There is
gallbladder wall thickening and sonographic Murphy's sign is
present - correlate clinically in assessing for acute
cholecystitis.
2.  Aortic atherosclerotic calcification.
3.  Hepatic cysts.

## 2013-11-29 NOTE — Telephone Encounter (Signed)
This encounter is closed. 

## 2014-01-13 ENCOUNTER — Telehealth: Payer: Self-pay | Admitting: Cardiology

## 2014-01-13 ENCOUNTER — Encounter: Payer: Self-pay | Admitting: *Deleted

## 2014-01-13 NOTE — Telephone Encounter (Signed)
Certified letter mailed.

## 2014-02-20 IMAGING — CR DG CHEST 2V
2 series · 2 of 2 positions shown · non-contrast
Comparison: 08/30/2011

CLINICAL DATA: Cough, congestion

CHEST - 2 VIEW

[view not recorded (1 of 2)]
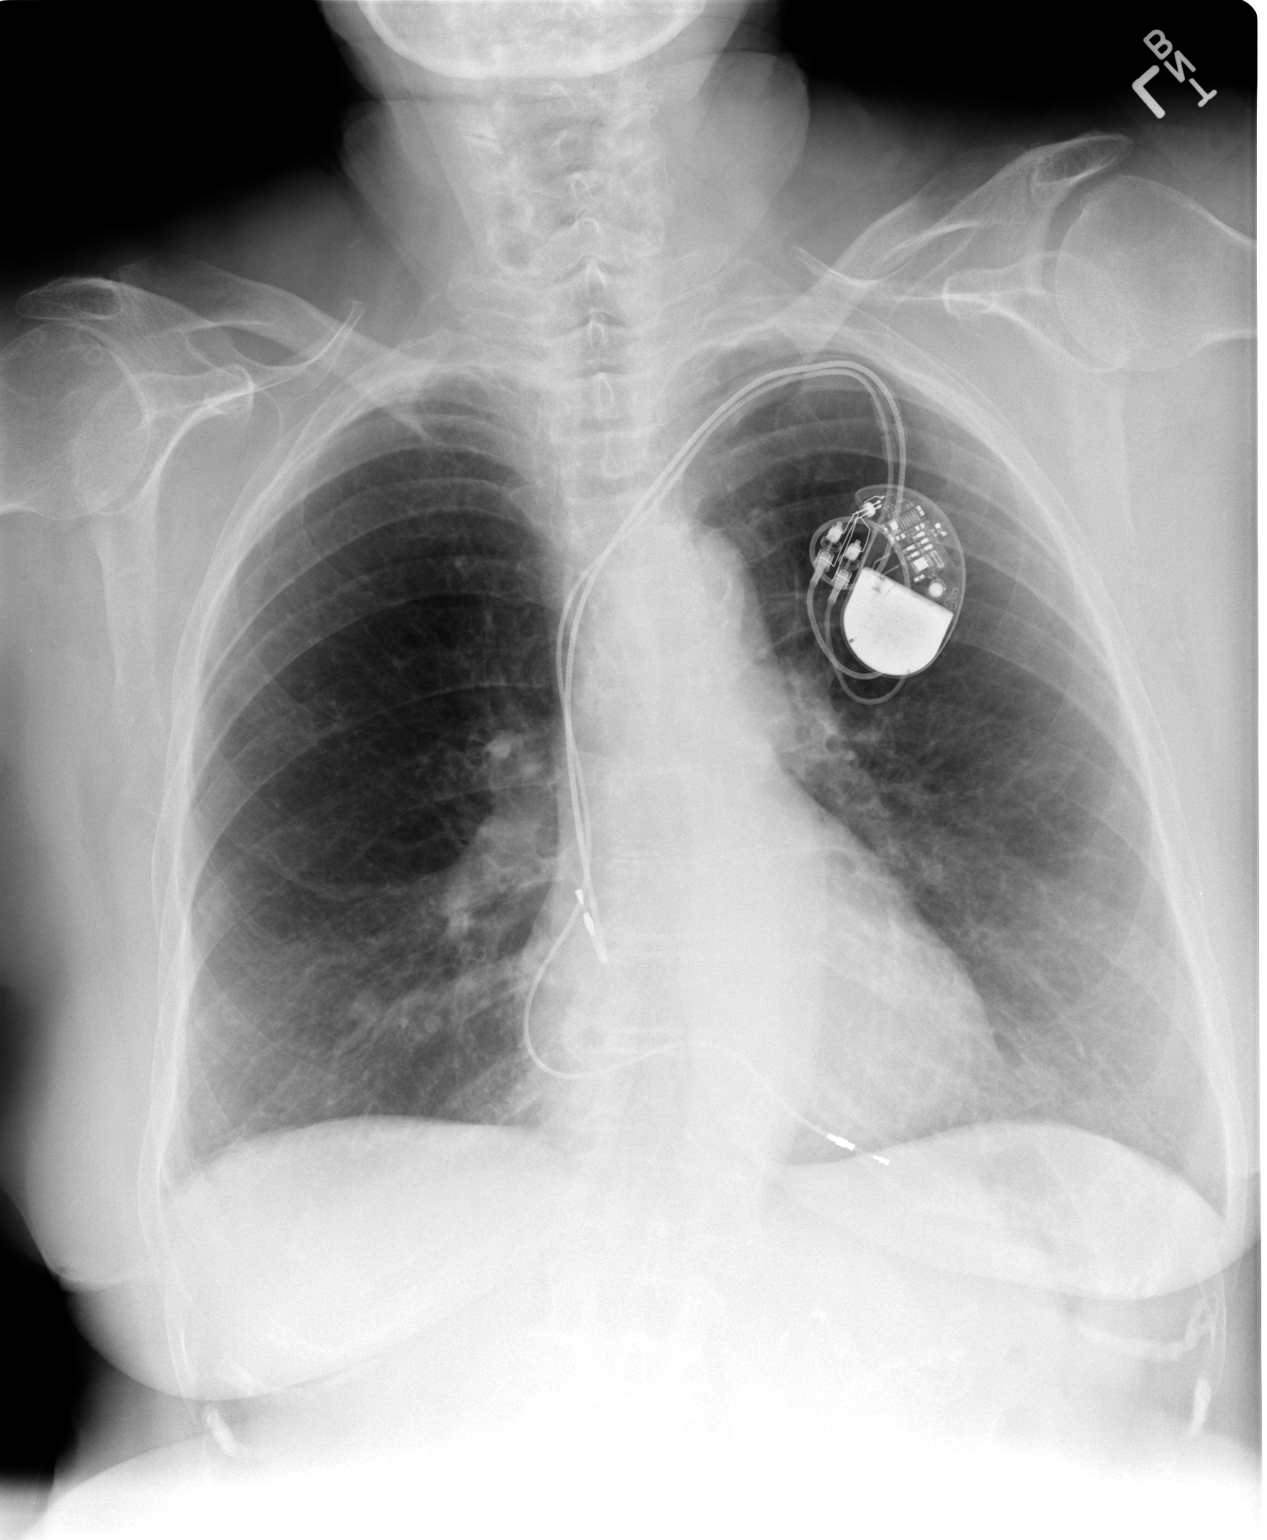

[view not recorded (2 of 2)]
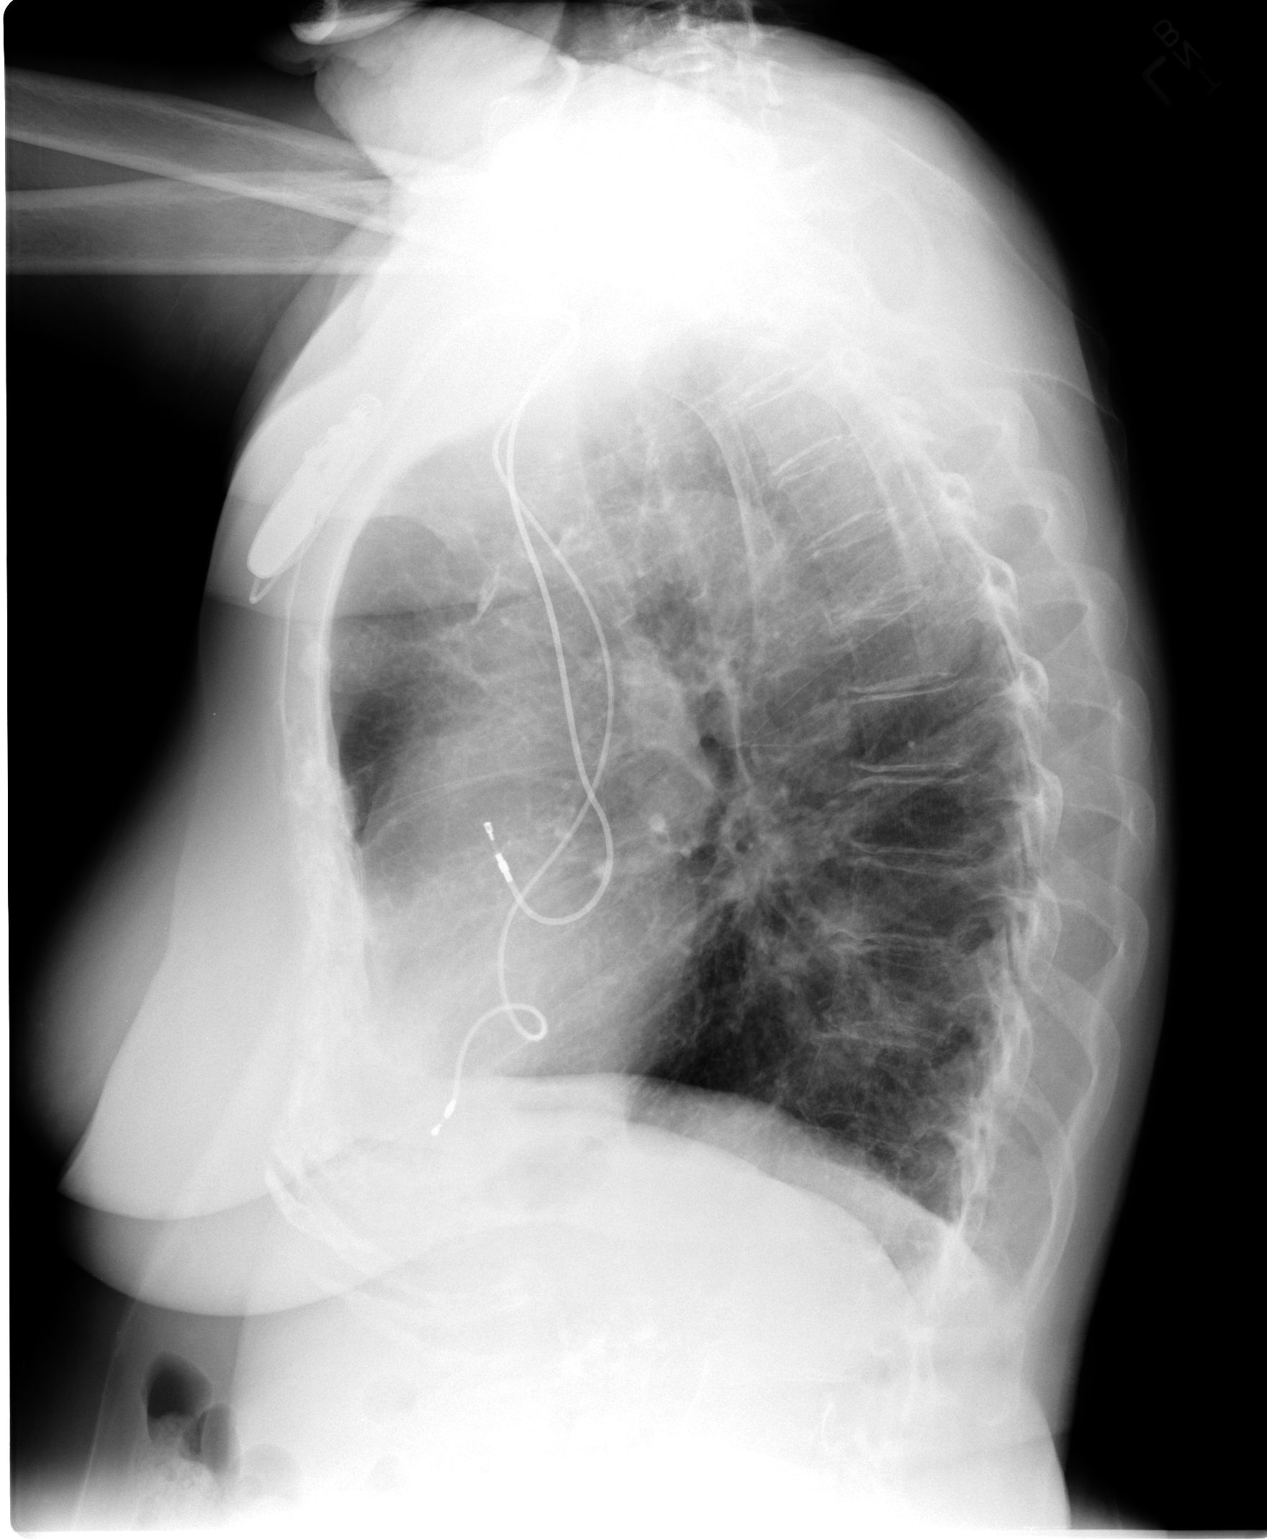

[2 of 2 positions shown; findings below may reference images not displayed]

FINDINGS: Left subclavian two lead pacer evident.  Normal heart
size and vascularity.  Mild background emphysema with slight
interstitial changes as before.  No superimposed pneumonia, edema,
collapse, consolidation, effusion or pneumothorax.  Trachea
midline.  Atherosclerotic changes and degenerative changes of the
spine.  There is an associated scoliosis of the spine.
IMPRESSION: Stable chronic changes.  No superimposed acute process.

## 2014-03-16 IMAGING — CR DG LUMBAR SPINE 2-3V
3 series · 3 of 3 positions shown · non-contrast
Comparison: 05/10/2004.

CLINICAL DATA: 81-year-old female low back pain times 3 weeks.

LUMBAR SPINE - 2-3 VIEW

[view not recorded (1 of 3)]
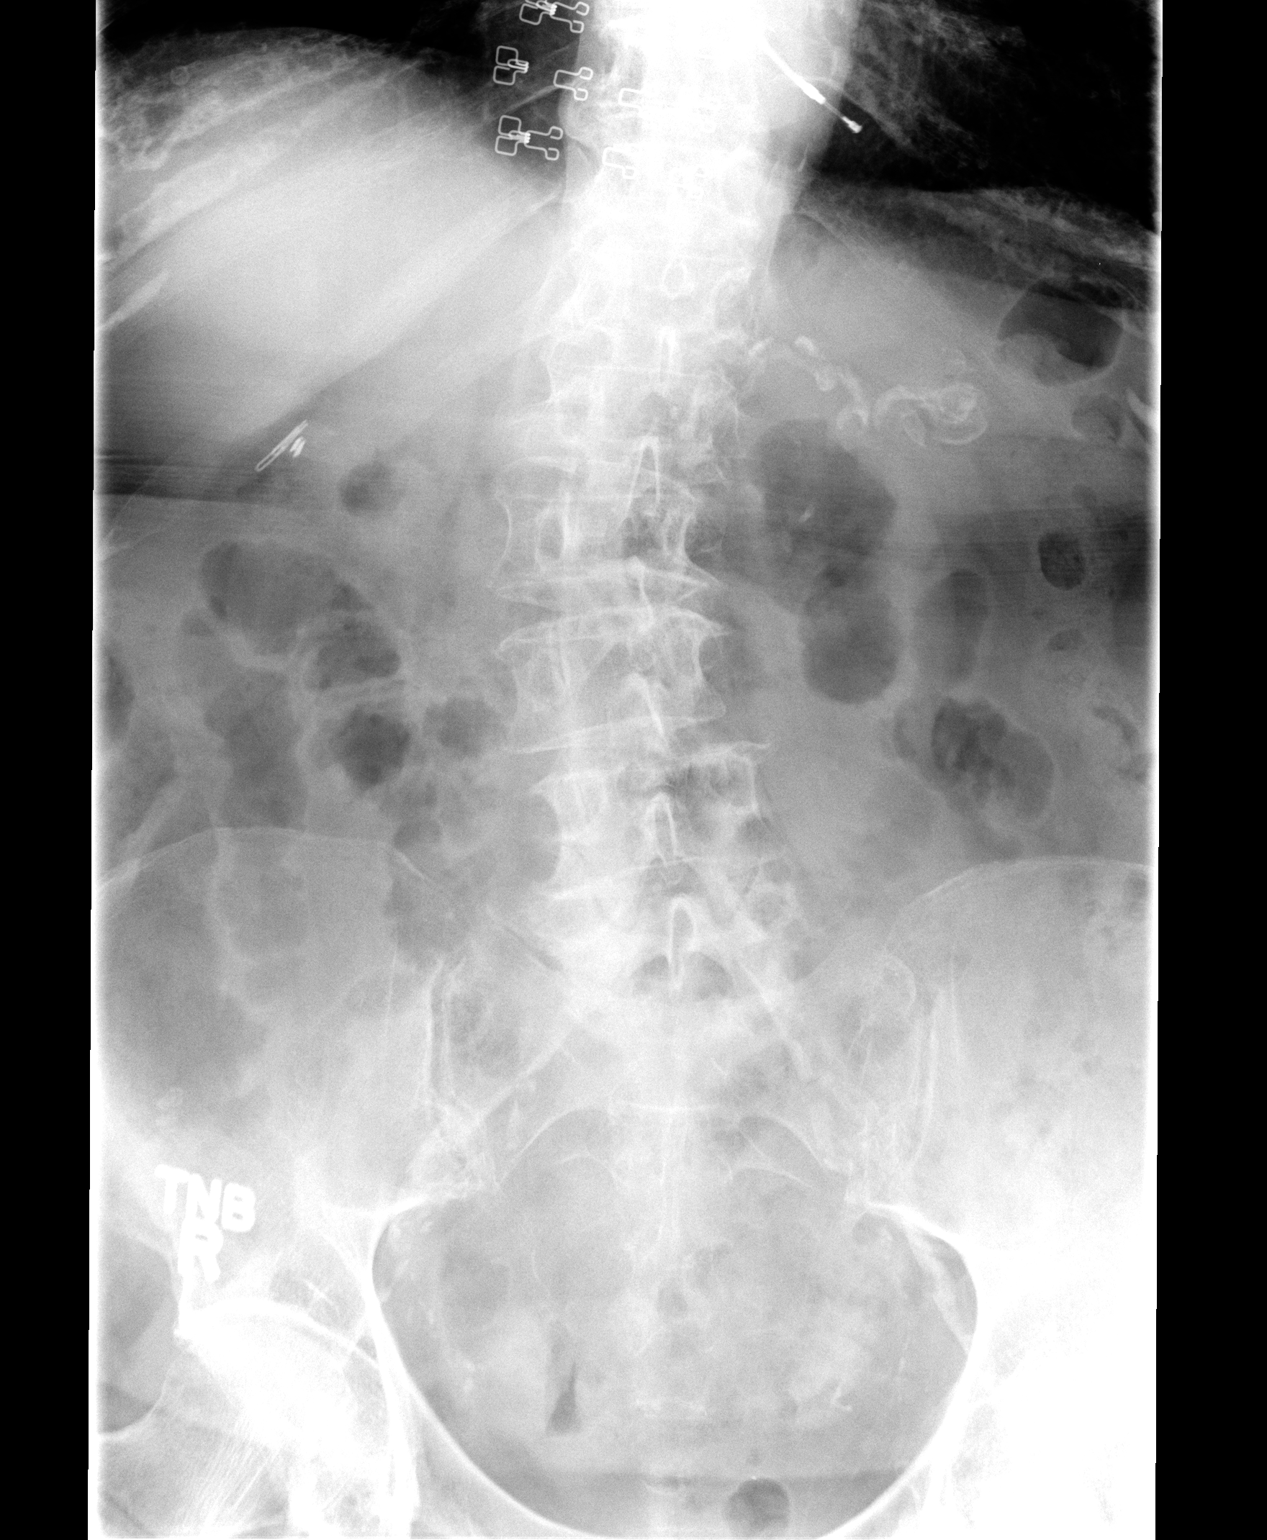

[view not recorded (2 of 3)]
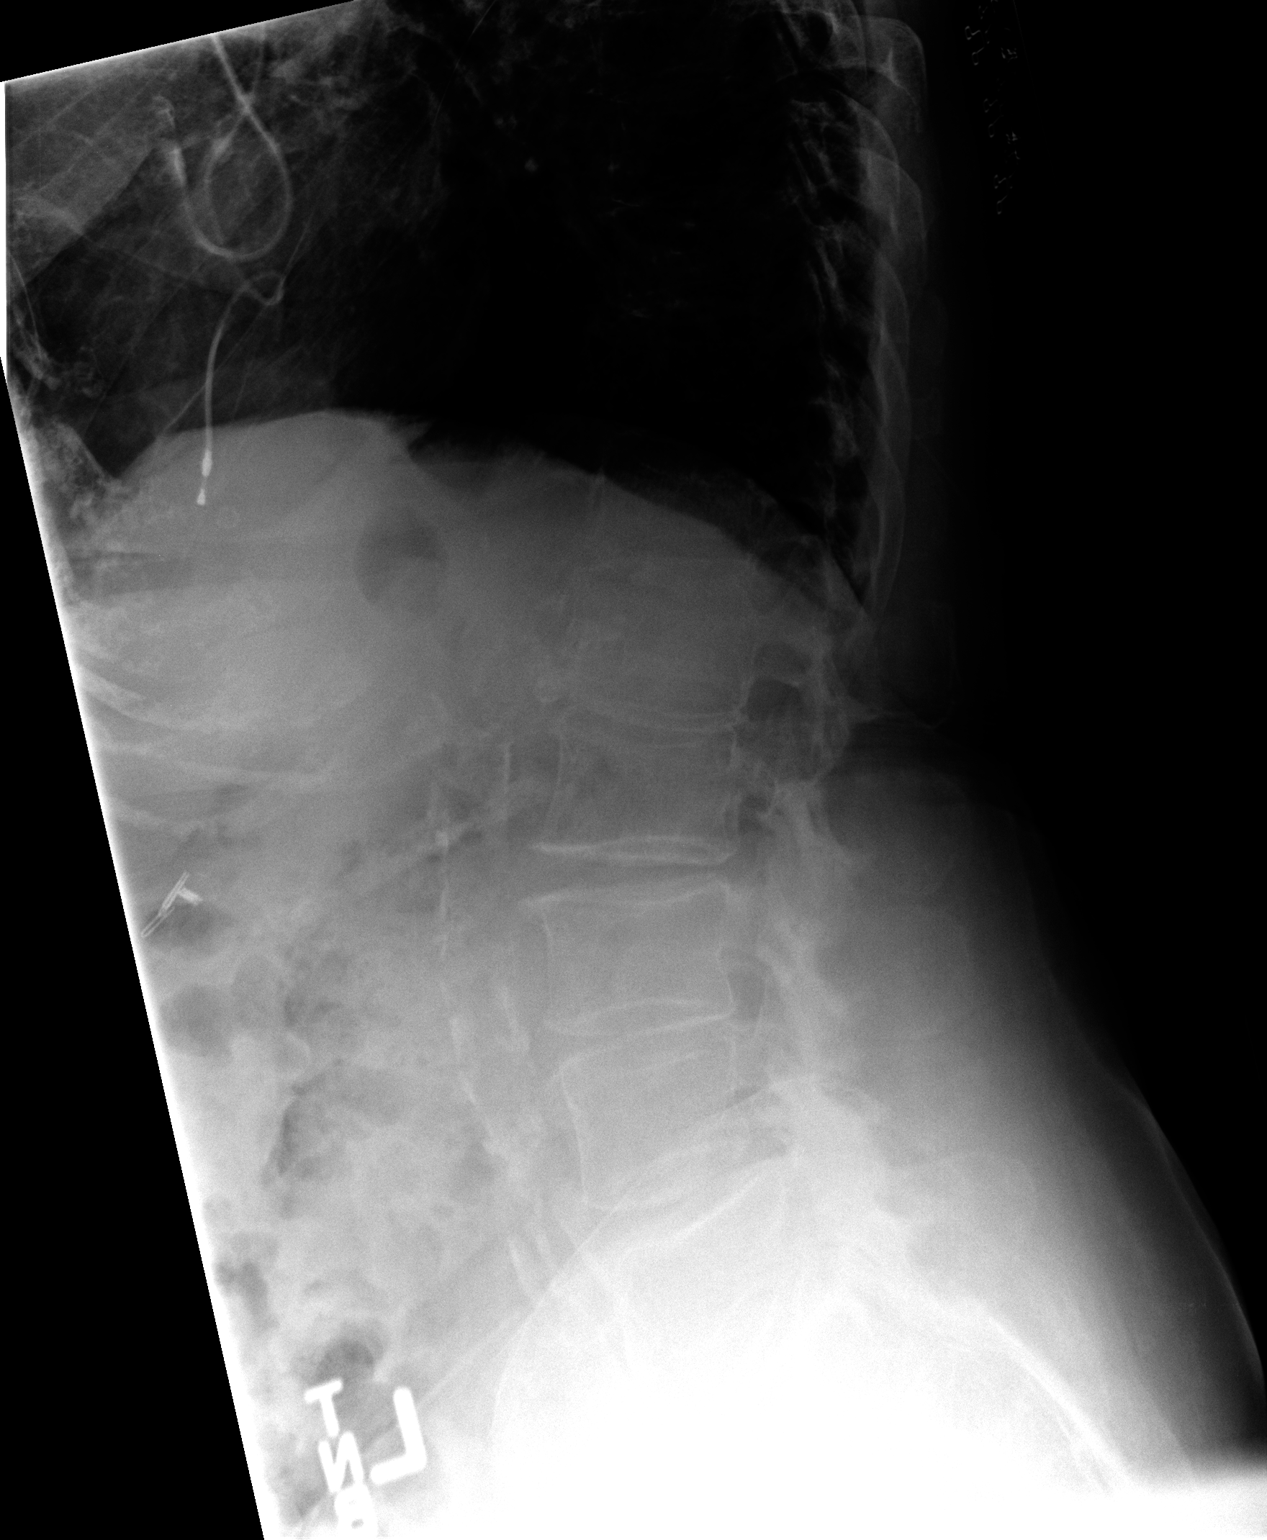

[view not recorded (3 of 3)]
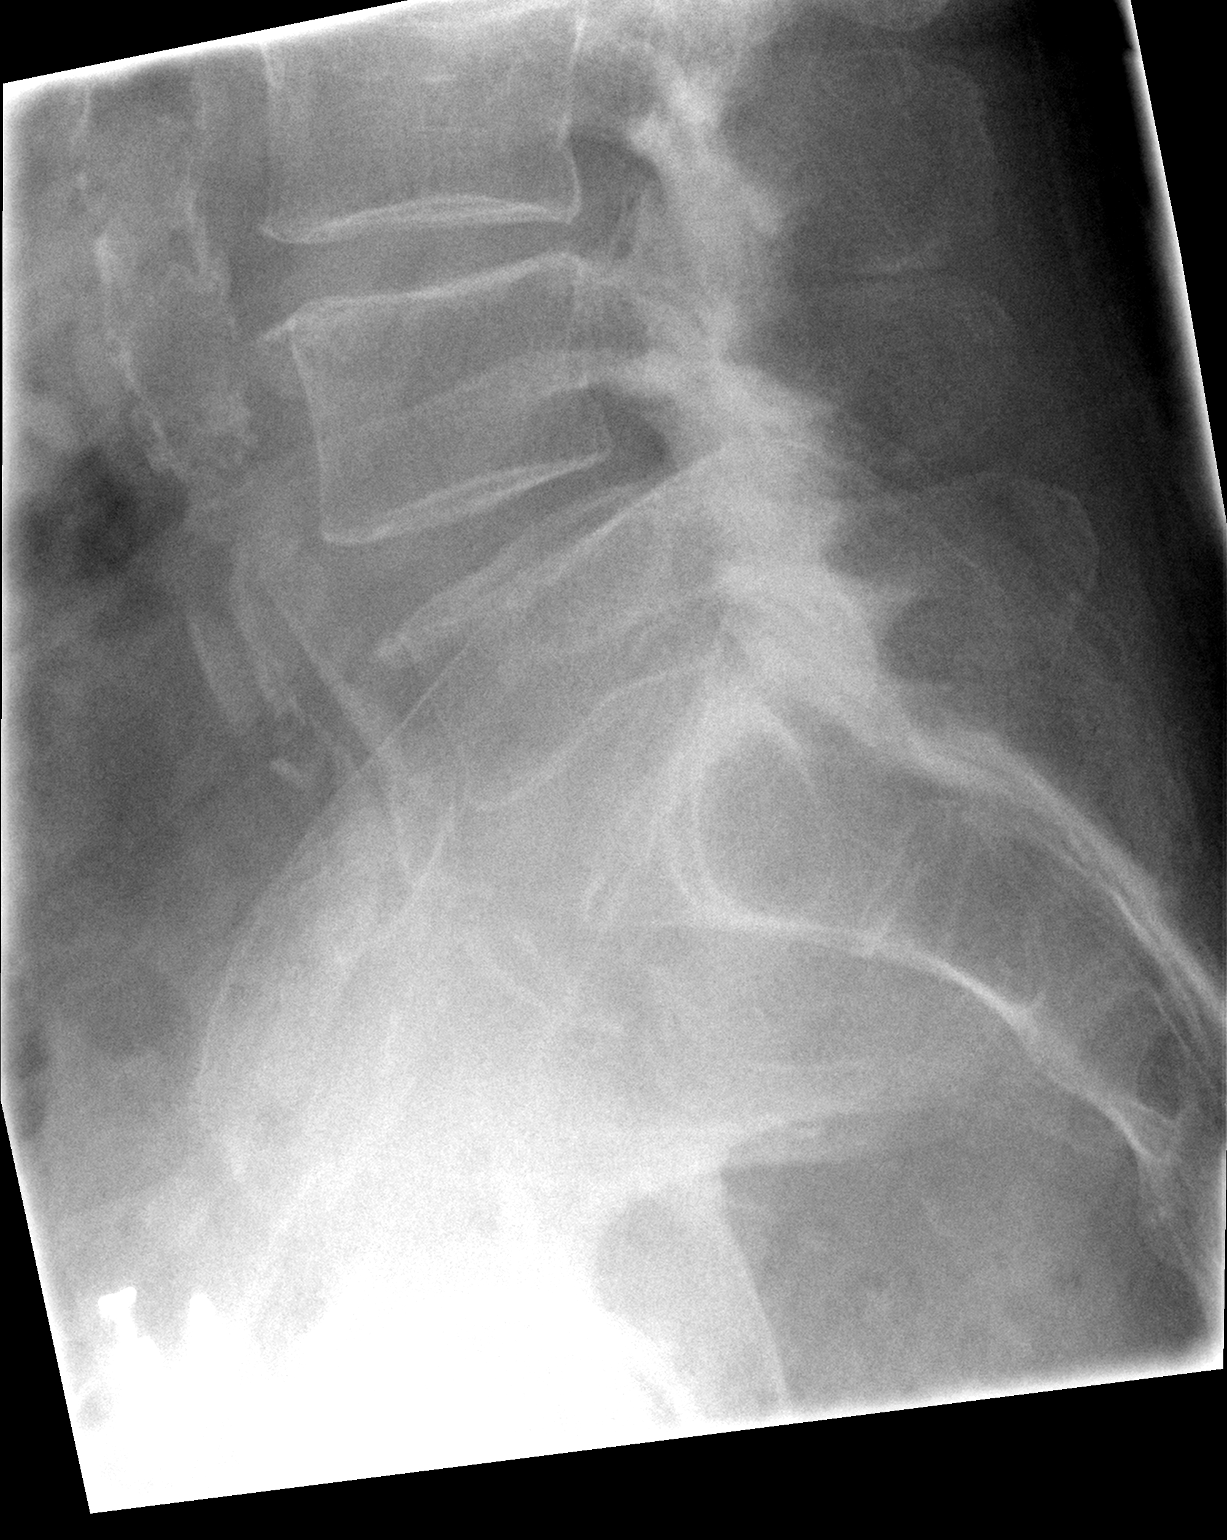

[3 of 3 positions shown; findings below may reference images not displayed]

FINDINGS: Chronic dextroconvex scoliosis has mildly increased.
Normal lumbar segmentation.  Stable lumbar vertebral height and
alignment otherwise.  Disc spaces are stable.  Endplate spurring
has mildly progressed.  Calcified atherosclerosis of the aorta, its
branches, and iliac arteries is mildly progressed.  Cardiac
pacemaker lead visible.  New right upper quadrant surgical clips.
Chronic lower lumbar moderate facet hypertrophy, maximal L5-S1.
IMPRESSION: No acute osseous abnormality in the lumbar spine.  Chronic
scoliosis and lower lumbar facet degeneration.
